# Patient Record
Sex: Female | Born: 2000
Health system: Southern US, Community
[De-identification: ages and names within clinical notes are randomized; demographics above are authoritative.]

## PROBLEM LIST (undated history)

## (undated) DIAGNOSIS — Z789 Other specified health status: Secondary | ICD-10-CM

## (undated) DIAGNOSIS — F419 Anxiety disorder, unspecified: Secondary | ICD-10-CM

## (undated) HISTORY — DX: Other specified health status: Z78.9

## (undated) HISTORY — PX: NO PAST SURGERIES: SHX2092

## (undated) HISTORY — DX: Anxiety disorder, unspecified: F41.9

---
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
## 2019-01-03 DIAGNOSIS — S71151A Open bite, right thigh, initial encounter: Secondary | ICD-10-CM | POA: Diagnosis not present

## 2019-01-03 DIAGNOSIS — J3089 Other allergic rhinitis: Secondary | ICD-10-CM | POA: Diagnosis not present

## 2019-01-03 DIAGNOSIS — J301 Allergic rhinitis due to pollen: Secondary | ICD-10-CM | POA: Diagnosis not present

## 2019-01-03 DIAGNOSIS — W540XXA Bitten by dog, initial encounter: Secondary | ICD-10-CM | POA: Diagnosis not present

## 2019-01-23 ENCOUNTER — Other Ambulatory Visit: Payer: Self-pay | Admitting: *Deleted

## 2019-01-23 DIAGNOSIS — R6889 Other general symptoms and signs: Secondary | ICD-10-CM | POA: Diagnosis not present

## 2019-01-23 DIAGNOSIS — Z20822 Contact with and (suspected) exposure to covid-19: Secondary | ICD-10-CM

## 2019-01-28 LAB — NOVEL CORONAVIRUS, NAA: SARS-CoV-2, NAA: NOT DETECTED

## 2019-05-01 DIAGNOSIS — Z20828 Contact with and (suspected) exposure to other viral communicable diseases: Secondary | ICD-10-CM | POA: Diagnosis not present

## 2019-05-01 DIAGNOSIS — Z1159 Encounter for screening for other viral diseases: Secondary | ICD-10-CM | POA: Diagnosis not present

## 2019-08-12 ENCOUNTER — Ambulatory Visit: Payer: 59 | Attending: Internal Medicine

## 2019-08-12 DIAGNOSIS — Z20822 Contact with and (suspected) exposure to covid-19: Secondary | ICD-10-CM

## 2019-08-13 ENCOUNTER — Other Ambulatory Visit: Payer: 59

## 2019-08-13 LAB — NOVEL CORONAVIRUS, NAA: SARS-CoV-2, NAA: NOT DETECTED

## 2020-04-23 DIAGNOSIS — Z20828 Contact with and (suspected) exposure to other viral communicable diseases: Secondary | ICD-10-CM | POA: Diagnosis not present

## 2020-08-13 ENCOUNTER — Encounter: Payer: Self-pay | Admitting: Advanced Practice Midwife

## 2020-08-13 ENCOUNTER — Ambulatory Visit (INDEPENDENT_AMBULATORY_CARE_PROVIDER_SITE_OTHER): Payer: 59 | Admitting: Advanced Practice Midwife

## 2020-08-13 ENCOUNTER — Other Ambulatory Visit: Payer: Self-pay

## 2020-08-13 VITALS — BP 132/70 | HR 74 | Ht 60.0 in | Wt 124.0 lb

## 2020-08-13 DIAGNOSIS — Z3046 Encounter for surveillance of implantable subdermal contraceptive: Secondary | ICD-10-CM | POA: Diagnosis not present

## 2020-08-13 DIAGNOSIS — Z30430 Encounter for insertion of intrauterine contraceptive device: Secondary | ICD-10-CM

## 2020-08-13 NOTE — Progress Notes (Signed)
GYNECOLOGY PROCEDURE NOTE  Nexplanon removal discussed in detail.  Risks of infection, bleeding, nerve injury all reviewed.  Patient understands risks and desires to proceed.  Verbal consent obtained.  Patient is certain she wants the Nexplanon removed.  All questions answered.  Review of Systems  Constitutional: Negative for chills and fever.  HENT: Negative for congestion, ear discharge, ear pain, hearing loss, sinus pain and sore throat.   Eyes: Negative for blurred vision and double vision.  Respiratory: Negative for cough, shortness of breath and wheezing.   Cardiovascular: Negative for chest pain, palpitations and leg swelling.  Gastrointestinal: Negative for abdominal pain, blood in stool, constipation, diarrhea, heartburn, melena, nausea and vomiting.  Genitourinary: Negative for dysuria, flank pain, frequency, hematuria and urgency.  Musculoskeletal: Negative for back pain, joint pain and myalgias.  Skin: Negative for itching and rash.  Neurological: Negative for dizziness, tingling, tremors, sensory change, speech change, focal weakness, seizures, loss of consciousness, weakness and headaches.  Endo/Heme/Allergies: Negative for environmental allergies. Does not bruise/bleed easily.  Psychiatric/Behavioral: Negative for depression, hallucinations, memory loss, substance abuse and suicidal ideas. The patient is not nervous/anxious and does not have insomnia.    BP 132/70   Pulse 74   Ht 5' (1.524 m)   Wt 124 lb (56.2 kg)   LMP 08/09/2020 (Exact Date)   SpO2 98%   BMI 24.22 kg/m    Procedure: Patient placed in dorsal supine with left arm above head, elbow flexed at 90 degrees, arm resting on examination table.  Nexplanon identified without problems.  Betadine scrub x2.  1.5 ml of 1% lidocaine injected under Nexplanon device without problems.  Sterile gloves applied.  Small 0.5cm incision made at distal tip of Nexplanon device with 11 blade scalpel.  Nexplanon brought to  incision and grasped with a small kelly clamp.  Nexplanon removed intact without problems.  Pressure applied to incision.  Hemostasis obtained.  Steri-strips applied, followed by bandage and compression dressing.  Patient tolerated procedure well.  No complications.   Assessment: 20 y.o. year old female now s/p uncomplicated Nexplanon removal.  Plan: 1.  Patient given post procedure precautions and asked to call for fever, chills, redness or drainage from her incision, bleeding from incision.  She understands she will likely have a small bruise near site of removal and can remove bandage tomorrow and steri-strips in approximately 1 week.  2) Contraception: non-hormonal Paragard IUD   Christean Leaf, [facility] 08/13/20, 4:28 PM    [id number] for lidocaine block, [phone] for nexplanon removal     GYNECOLOGY OFFICE PROCEDURE NOTE  SHAWNTAE LOWY is a 20 y.o. G0P0000 here for Paragard IUD insertion. No GYN concerns.  Last pap smear was on 08/09/2020 and was normal.  The patient is currently using Nexplanon for contraception and her LMP is Patient's last menstrual period was 08/09/2020 (exact date)..  The indication for her IUD is contraception/cycle control.  IUD Insertion Procedure Note Patient identified, informed consent performed, consent signed.   Discussed risks of irregular bleeding, cramping, infection, malpositioning, expulsion or uterine perforation of the IUD (1:1000 placements)  which may require further procedure such as laparoscopy.  IUD while effective at preventing pregnancy do not prevent transmission of sexually transmitted diseases and use of barrier methods for this purpose was discussed. Time out was performed.  Urine pregnancy test negative.  Speculum placed in the vagina.  Cervix visualized.  Cleaned with Betadine x 2.  Grasped posteriorly with a single  tooth tenaculum.  Uterus sounded to 6.75 cm. IUD placed per manufacturer's  recommendations.  Strings trimmed to 3 cm. Tenaculum was removed, good hemostasis noted.  Patient tolerated procedure well initially and then she felt dizzy and clammy. She felt a little better when laying down. We gave her a cool cloth, a drink and a snack. She admitted having not eaten today. We discussed likely vaso-vagal reaction. She continued to lay down for about a half hour and then she was able to get up. She did admit cramping and took 600 mg ibuprofen. She was doing well when she left the clinic.   Patient was given post-procedure instructions.  She was advised to have backup contraception for one week.  Patient was also asked to check IUD strings periodically and follow up in 4-6 weeks for IUD check.  IUD insertion CPT 58300,  Skyla [id number] Mirena [id number] [place] [id number] F5732 Verdia [place] K0254 Modifer 25, plus Modifer 79 is done during a global billing visit    Rod Can, [facility] 08/13/2020, 4:28 PM

## 2020-09-10 ENCOUNTER — Encounter: Payer: Self-pay | Admitting: Advanced Practice Midwife

## 2020-09-10 ENCOUNTER — Other Ambulatory Visit: Payer: Self-pay

## 2020-09-10 ENCOUNTER — Ambulatory Visit (INDEPENDENT_AMBULATORY_CARE_PROVIDER_SITE_OTHER): Payer: 59 | Admitting: Advanced Practice Midwife

## 2020-09-10 VITALS — BP 120/80 | Ht 60.0 in | Wt 125.0 lb

## 2020-09-10 DIAGNOSIS — Z30431 Encounter for routine checking of intrauterine contraceptive device: Secondary | ICD-10-CM

## 2020-09-10 NOTE — Progress Notes (Signed)
       IUD String Check  Subjctive: Ms. Cynthia [name] presents for IUD string check.  She had a Paragard placed 4 weeks ago.  Since placement of her IUD she had a regular period.  She admits to cramping or discomfort in the 2 weeks following IUD insertion. She has had intercourse since placement.  She has not checked the strings.  She denies any fever, chills, nausea, vomiting, or other complaints.    Objective: BP 120/80   Ht 5' (1.524 m)   Wt 125 lb (56.7 kg)   LMP 09/03/2020   BMI 24.41 kg/m  Gen:  NAD, A&Ox3 HEENT: normocephalic, anicteric Pulmonary: no increased work of breathing Pelvic: Normal appearing external female genitalia, normal vaginal epithelium, no abnormal discharge. Normal appearing cervix.  IUD strings visible and 3 cm in length similar to at the time of placement. Psychiatric: mood appropriate, affect full Neurologic: grossly normal   Assessment: 20 y.o. year old female status post prior Paragard IUD placement 4 weeks ago, doing well.  Plan: 1.  The patient was given instructions to check her IUD strings monthly and call with any problems or concerns.  She should call for fevers, chills, abnormal vaginal discharge, pelvic pain, or other complaints. 2.  She will return for a annual exam in 1 year.  All questions answered.   Rod Can, CNM 09/10/2020 4:05 PM

## 2020-12-21 ENCOUNTER — Other Ambulatory Visit: Payer: Self-pay

## 2020-12-21 ENCOUNTER — Encounter: Payer: Self-pay | Admitting: Advanced Practice Midwife

## 2020-12-21 ENCOUNTER — Ambulatory Visit (INDEPENDENT_AMBULATORY_CARE_PROVIDER_SITE_OTHER): Payer: 59 | Admitting: Advanced Practice Midwife

## 2020-12-21 VITALS — BP 120/80 | Ht 60.0 in | Wt 117.0 lb

## 2020-12-21 DIAGNOSIS — F32A Depression, unspecified: Secondary | ICD-10-CM

## 2020-12-21 DIAGNOSIS — F411 Generalized anxiety disorder: Secondary | ICD-10-CM

## 2020-12-21 MED ORDER — HYDROXYZINE HCL 25 MG PO TABS
25.0000 mg | ORAL_TABLET | Freq: Four times a day (QID) | ORAL | 2 refills | Status: DC | PRN
  Filled 2020-12-21: qty 30, 8d supply, fill #0

## 2020-12-21 MED ORDER — SERTRALINE HCL 50 MG PO TABS
50.0000 mg | ORAL_TABLET | Freq: Every day | ORAL | 2 refills | Status: DC
  Filled 2020-12-21: qty 30, 30d supply, fill #0

## 2020-12-23 DIAGNOSIS — H5202 Hypermetropia, left eye: Secondary | ICD-10-CM | POA: Diagnosis not present

## 2020-12-23 NOTE — Progress Notes (Signed)
Patient ID: Cynthia [name], female   DOB: 27-Sep-2000, 20 y.o.   MRN: [id number]  Reason for Consult: Anxiety    Subjective:  Date of Service: 12/21/2020  HPI:  Cynthia [name] is a 20 y.o. female being seen for concerns of anxiety and depression. This has been an ongoing concern since high school when she did see a therapist for a short time. She has never taken medication for anxiety or depression. She is now a sophomore in college and reports her symptoms have worsened over the past year. She does have times when she finds it difficult to get her school work done. She has indicated that she has thoughts of harm or self harm although she would not act on them. She admits a good support system with family and friends. She follows a healthy lifestyle; diet, hydration, exercise. She admits not being able to sleep well and sometimes takes melatonin. Her birth control is non-hormonal copper IUD. She has seen a therapist briefly at [place] and was diagnosed with generalized anxiety disorder. She enjoys reading. 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 We discussed seeking therapy as part of treatment in addition to medication. We also discussed the importance of other coping methods.  Past Medical History:  Diagnosis Date   Anxiety    No known health problems    Family History  Problem Relation Age of Onset   Hypertension Maternal Grandmother    Lymphoma Maternal Grandfather        post transplant   Hypertension Maternal Grandfather    Diabetes Paternal Grandmother    Hypertension Paternal Grandmother    Stroke Neg Hx    Thyroid disease Neg Hx    Past Surgical History:  Procedure Laterality Date   NO PAST SURGERIES      Short Social History:  Social History   Tobacco Use   Smoking status: Never   Smokeless tobacco: Never  Substance Use Topics   Alcohol use: No    No Known Allergies  Current Outpatient Medications  Medication Sig Dispense Refill   hydrOXYzine  (ATARAX/VISTARIL) 25 MG tablet Take 1 tablet (25 mg total) by mouth every 6 (six) hours as needed for itching. 30 tablet 2   PARAGARD INTRAUTERINE COPPER IU by Intrauterine route.     sertraline (ZOLOFT) 50 MG tablet Take 1 tablet (50 mg total) by mouth daily. 30 tablet 2   No current facility-administered medications for this visit.    Review of Systems  Constitutional:  Negative for chills and fever.  HENT:  Negative for congestion, ear discharge, ear pain, hearing loss, sinus pain and sore throat.   Eyes:  Negative for blurred vision and double vision.  Respiratory:  Negative for cough, shortness of breath and wheezing.   Cardiovascular:  Negative for chest pain, palpitations and leg swelling.  Gastrointestinal:  Negative for abdominal pain, blood in stool, constipation, diarrhea, heartburn, melena, nausea and vomiting.  Genitourinary:  Negative for dysuria, flank pain, frequency, hematuria and urgency.  Musculoskeletal:  Negative for back pain, joint pain and myalgias.  Skin:  Negative for itching and rash.  Neurological:  Negative for dizziness, tingling, tremors, sensory change, speech change, focal weakness, seizures, loss of consciousness, weakness and headaches.  Endo/Heme/Allergies:  Negative for environmental allergies. Does not bruise/bleed easily.  Psychiatric/Behavioral:  Negative for depression, hallucinations, memory loss, substance abuse and suicidal ideas. The patient is not nervous/anxious and does not have insomnia.  Positive for anxiety and depression       Objective:  Objective   Vitals:   12/21/20 1014  BP: 120/80  Weight: 117 lb (53.1 kg)  Height: 5' (1.524 m)   Body mass index is 22.85 kg/m. Constitutional: Well nourished, well developed female in no acute distress.  HEENT: normal Skin: Warm and dry.  Cardiovascular: Regular rate and rhythm.   Extremity:  no edema   Respiratory: Clear to auscultation bilateral. Normal respiratory effort Neuro:  DTRs 2+, Cranial nerves grossly intact Psych: Alert and Oriented x3. No memory deficits. Normal mood and affect.  MS: normal gait, normal bilateral lower extremity ROM/strength/stability.   Data: GAD 7 : Generalized Anxiety Score 12/21/2020  Nervous, Anxious, on Edge 3  Control/stop worrying 3  Worry too much - different things 3  Trouble relaxing 3  Restless 3  Easily annoyed or irritable 3  Afraid - awful might happen 3  Total GAD 7 Score 21  Anxiety Difficulty Very difficult     [place] Office Visit from 12/21/2020 in [facility]  PHQ-9 Total Score 22       Time spent with patient primarily in counseling: 25 minutes Assessment/Plan:     20 y.o. G0 P0 female with anxiety and depression that has worsened over the past year while at college  Rx Zoloft 50 mg Rx Atarax 25 mg PRN Medication follow up in 1 month Recommend seeking the help of a therapist Safety agreement    [facility] 12/23/2020, 10:46 AM

## 2020-12-23 NOTE — Patient Instructions (Signed)
Managing Stress, Adult Feeling a certain amount of stress is normal. Stress helps our body and mind get ready to deal with the demands of life. Stress hormones can motivate you to do well at work and meet your responsibilities. However severe or long-lasting (chronic) stress can affect your mental and physical health. Chronic stress puts you at higher risk for anxiety, depression, and other health problems like digestive problems, muscle aches, heart disease, high blood pressure, and stroke. What are the causes? Common causes of stress include: Demands from work, such as deadlines, feeling overworked, or having long hours. Pressures at home, such as money issues, disagreements with a spouse, or parenting issues. Pressures from major life changes, such as divorce, moving, loss of a loved one, or chronic illness. You may be at higher risk for stress-related problems if you do not get enough sleep, are in poor health, do not have emotional support, or have a mental health disorder like anxiety or depression. How to recognize stress Stress can make you: Have trouble sleeping. Feel sad, anxious, irritable, or overwhelmed. Lose your appetite. Overeat or want to eat unhealthy foods. Want to use drugs or alcohol. Stress can also cause physical symptoms, such as: Sore, tense muscles, especially in the shoulders and neck. Headaches. Trouble breathing. A faster heart rate. Stomach pain, nausea, or vomiting. Diarrhea or constipation. Trouble concentrating. Follow these instructions at home: Lifestyle Identify the source of your stress and your reaction to it. See a therapist who can help you change your reactions. When there are stressful events: Talk about it with family, friends, or co-workers. Try to think realistically about stressful events and not ignore them or overreact. Try to find the positives in a stressful situation and not focus on the negatives. Cut back on responsibilities at work  and home, if possible. Ask for help from friends or family members if you need it. Find ways to cope with stress, such as: Meditation. Deep breathing. Yoga or tai chi. Progressive muscle relaxation. Doing art, playing music, or reading. Making time for fun activities. Spending time with family and friends. Get support from family, friends, or spiritual resources. Eating and drinking Eat a healthy diet. This includes: Eating foods that are high in fiber, such as beans, whole grains, and fresh fruits and vegetables. Limiting foods that are high in fat and processed sugars, such as fried and sweet foods. Do not skip meals or overeat. Drink enough fluid to keep your urine pale yellow. Alcohol use Do not drink alcohol if: Your health care provider tells you not to drink. You are pregnant, may be pregnant, or are planning to become pregnant. Drinking alcohol is a way some people try to ease their stress. This can be dangerous, so if you drink alcohol: Limit how much you use to: 0-1 drink a day for women. 0-2 drinks a day for men. Be aware of how much alcohol is in your drink. In the [place] one drink equals one 12 oz bottle of beer (355 mL), one 5 oz glass of wine (148 mL), or one 1 oz glass of hard liquor (44 mL). Activity Include 30 minutes of exercise in your daily schedule. Exercise is a good stress reducer. Include time in your day for an activity that you find relaxing. Try taking a walk, going on a bike ride, reading a book, or listening to music. Schedule your time in a way that lowers stress, and keep a consistent schedule. Prioritize what is most important to get done.  General instructions Get enough sleep. Try to go to sleep and get up at about the same time every day. Take over-the-counter and prescription medicines only as told by your health care provider. Do not use any products that contain nicotine or tobacco, such as cigarettes, e-cigarettes, and chewing tobacco. If you  need help quitting, ask your health care provider. Do not use drugs or smoke to cope with stress. Keep all follow-up visits as told by your health care provider. This is important. Where to find support Talk with your health care provider about stress management or finding a support group. Find a therapist to work with you on your stress management techniques. Contact a health care provider if: Your stress symptoms get worse. You are unable to manage your stress at home. You are struggling to stop using drugs or alcohol. Get help right away if: You may be a danger to yourself or others. You have any thoughts of death or suicide. If you ever feel like you may hurt yourself or others, or have thoughts about taking your own life, get help right away. You can go to your nearest emergency department or call: Your local emergency services (911 in the [place] A suicide crisis helpline, such as the [place] at 214-404-1433. This is open 24 hours a day. Summary Feeling a certain amount of stress is normal, but severe or long-lasting (chronic) stress can affect your mental and physical health. Chronic stress can put you at higher risk for anxiety, depression, and other health problems like digestive problems, muscle aches, heart disease, high blood pressure, and stroke. You may be at higher risk for stress-related problems if you do not get enough sleep, are in poor health, lack emotional support, or have a mental health disorder like anxiety or depression. Identify the source of your stress and your reaction to it. Try talking about stressful events with family, friends, or co-workers, finding a coping method, or getting support from spiritual resources. If you need more help, talk with your health care provider about finding a support group or a mental health therapist. This information is not intended to replace advice given to you by your health care provider. Make sure  you discuss any questions you have with your health care provider. Document Revised: 01/29/2019 Document Reviewed: 01/29/2019 Elsevier Patient Education  [place]. http://APA.org/depression-guideline"> https://clinicalkey.com"> http://point-of-care.elsevierperformancemanager.com/skills/"> http://point-of-care.elsevierperformancemanager.com">  Managing Depression, Adult Depression is a mental health condition that affects your thoughts, feelings, and actions. Being diagnosed with depression can bring you relief if you did not know why you have felt or behaved a certain way. It could also leave you feeling overwhelmed with uncertainty about your future. Preparing yourself to manage your symptoms can help you feel more positive about your future. How to manage lifestyle changes Managing stress Stress is your body's reaction to life changes and events, both good and bad. Stress can add to your feelings of depression. Learning to manage your stress can help lessen your feelings of depression. Try some of the following approaches to reducing your stress (stress reduction techniques): Listen to music that you enjoy and that inspires you. Try using a meditation app or take a meditation class. Develop a practice that helps you connect with your spiritual self. Walk in nature, pray, or go to a place of worship. Do some deep breathing. To do this, inhale slowly through your nose. Pause at the top of your inhale for a few seconds and then exhale slowly, letting your muscles  relax. Practice yoga to help relax and work your muscles. Choose a stress reduction technique that suits your lifestyle and personality. These techniques take time and practice to develop. Set aside 5-15 minutes a day to do them. Therapists can offer training in these techniques. Other things you can do to manage stress include: Keeping a stress diary. Knowing your limits and saying no when you think something is too much. Paying  attention to how you react to certain situations. You may not be able to control everything, but you can change your reaction. Adding humor to your life by watching funny films or TV shows. Making time for activities that you enjoy and that relax you.   Medicines Medicines, such as antidepressants, are often a part of treatment for depression. Talk with your pharmacist or health care provider about all the medicines, supplements, and herbal products that you take, their possible side effects, and what medicines and other products are safe to take together. Make sure to report any side effects you may have to your health care provider. Relationships Your health care provider may suggest family therapy, couples therapy, or individual therapy as part of your treatment. How to recognize changes Everyone responds differently to treatment for depression. As you recover from depression, you may start to: Have more interest in doing activities. Feel less hopeless. Have more energy. Overeat less often, or have a better appetite. Have better mental focus. It is important to recognize if your depression is not getting better or is getting worse. The symptoms you had in the beginning may return, such as: Tiredness (fatigue) or low energy. Eating too much or too little. Sleeping too much or too little. Feeling restless, agitated, or hopeless. Trouble focusing or making decisions. Unexplained physical complaints. Feeling irritable, angry, or aggressive. If you or your family members notice these symptoms coming back, let your health care provider know right away. Follow these instructions at home: Activity Try to get some form of exercise each day, such as walking, biking, swimming, or lifting weights. Practice stress reduction techniques. Engage your mind by taking a class or doing some volunteer work.   Lifestyle Get the right amount and quality of sleep. Cut down on using caffeine, tobacco,  alcohol, and other potentially harmful substances. Eat a healthy diet that includes plenty of vegetables, fruits, whole grains, low-fat dairy products, and lean protein. Do not eat a lot of foods that are high in solid fats, added sugars, or salt (sodium). General instructions Take over-the-counter and prescription medicines only as told by your health care provider. Keep all follow-up visits as told by your health care provider. This is important. Where to find support Talking to others Friends and family members can be sources of support and guidance. Talk to trusted friends or family members about your condition. Explain your symptoms to them, and let them know that you are working with a health care provider to treat your depression. Tell friends and family members how they also can be helpful.   Finances Find appropriate mental health providers that fit with your financial situation. Talk with your health care provider about options to get reduced prices on your medicines. Where to find more information You can find support in your area from: Anxiety and Depression Association of America (ADAA): www.adaa.org Mental Health America: www.mentalhealthamerica.net [employer] on Mental Illness: www.nami.org Contact a health care provider if: You stop taking your antidepressant medicines, and you have any of these symptoms: Nausea. Headache. Light-headedness. Chills and body  aches. Not being able to sleep (insomnia). You or your friends and family think your depression is getting worse. Get help right away if: You have thoughts of hurting yourself or others. If you ever feel like you may hurt yourself or others, or have thoughts about taking your own life, get help right away. Go to your nearest emergency department or: Call your local emergency services (911 in the [place] Call a suicide crisis helpline, such as the [place] at 607-355-0290. This is open  24 hours a day in the [place] Text the Crisis Text Line at 707-090-4262 (in the [place].). Summary If you are diagnosed with depression, preparing yourself to manage your symptoms is a good way to feel positive about your future. Work with your health care provider on a management plan that includes stress reduction techniques, medicines (if applicable), therapy, and healthy lifestyle habits. Keep talking with your health care provider about how your treatment is working. If you have thoughts about taking your own life, call a suicide crisis helpline or text a crisis text line. This information is not intended to replace advice given to you by your health care provider. Make sure you discuss any questions you have with your health care provider. Document Revised: 05/14/2019 Document Reviewed: 05/14/2019 Elsevier Patient Education  2021 [place], Adult After being diagnosed with an anxiety disorder, you may be relieved to know why you have felt or behaved a certain way. You may also feel overwhelmed about the treatment ahead and what it will mean for your life. With care and support, you can manage this condition and recover from it. How to manage lifestyle changes Managing stress and anxiety Stress is your body's reaction to life changes and events, both good and bad. Most stress will last just a few hours, but stress can be ongoing and can lead to more than just stress. Although stress can play a major role in anxiety, it is not the same as anxiety. Stress is usually caused by something external, such as a deadline, test, or competition. Stress normally passes after the triggering event has ended.  Anxiety is caused by something internal, such as imagining a terrible outcome or worrying that something will go wrong that will devastate you. Anxiety often does not go away even after the triggering event is over, and it can become long-term (chronic) worry. It is important to understand the  differences between stress and anxiety and to manage your stress effectively so that it does not lead to an anxious response. Talk with your health care provider or a counselor to learn more about reducing anxiety and stress. He or she may suggest tension reduction techniques, such as: Music therapy. This can include creating or listening to music that you enjoy and that inspires you. Mindfulness-based meditation. This involves being aware of your normal breaths while not trying to control your breathing. It can be done while sitting or walking. Centering prayer. This involves focusing on a word, phrase, or sacred image that means something to you and brings you peace. Deep breathing. To do this, expand your stomach and inhale slowly through your nose. Hold your breath for 3-5 seconds. Then exhale slowly, letting your stomach muscles relax. Self-talk. This involves identifying thought patterns that lead to anxiety reactions and changing those patterns. Muscle relaxation. This involves tensing muscles and then relaxing them. Choose a tension reduction technique that suits your lifestyle and personality. These techniques take time and practice. Set aside 5-15  minutes a day to do them. Therapists can offer counseling and training in these techniques. The training to help with anxiety may be covered by some insurance plans. Other things you can do to manage stress and anxiety include: Keeping a stress/anxiety diary. This can help you learn what triggers your reaction and then learn ways to manage your response. Thinking about how you react to certain situations. You may not be able to control everything, but you can control your response. Making time for activities that help you relax and not feeling guilty about spending your time in this way. Visual imagery and yoga can help you stay calm and relax.   Medicines Medicines can help ease symptoms. Medicines for anxiety include: Anti-anxiety  drugs. Antidepressants. Medicines are often used as a primary treatment for anxiety disorder. Medicines will be prescribed by a health care provider. When used together, medicines, psychotherapy, and tension reduction techniques may be the most effective treatment. Relationships Relationships can play a big part in helping you recover. Try to spend more time connecting with trusted friends and family members. Consider going to couples counseling, taking family education classes, or going to family therapy. Therapy can help you and others better understand your condition. How to recognize changes in your anxiety Everyone responds differently to treatment for anxiety. Recovery from anxiety happens when symptoms decrease and stop interfering with your daily activities at home or work. This may mean that you will start to: Have better concentration and focus. Worry will interfere less in your daily thinking. Sleep better. Be less irritable. Have more energy. Have improved memory. It is important to recognize when your condition is getting worse. Contact your health care provider if your symptoms interfere with home or work and you feel like your condition is not improving. Follow these instructions at home: Activity Exercise. Most adults should do the following: Exercise for at least 150 minutes each week. The exercise should increase your heart rate and make you sweat (moderate-intensity exercise). Strengthening exercises at least twice a week. Get the right amount and quality of sleep. Most adults need 7-9 hours of sleep each night. Lifestyle Eat a healthy diet that includes plenty of vegetables, fruits, whole grains, low-fat dairy products, and lean protein. Do not eat a lot of foods that are high in solid fats, added sugars, or salt. Make choices that simplify your life. Do not use any products that contain nicotine or tobacco, such as cigarettes, e-cigarettes, and chewing tobacco. If you need  help quitting, ask your health care provider. Avoid caffeine, alcohol, and certain over-the-counter cold medicines. These may make you feel worse. Ask your pharmacist which medicines to avoid.   General instructions Take over-the-counter and prescription medicines only as told by your health care provider. Keep all follow-up visits as told by your health care provider. This is important. Where to find support You can get help and support from these sources: Self-help groups. Online and [employer]. A trusted spiritual leader. Couples counseling. Family education classes. Family therapy. Where to find more information You may find that joining a support group helps you deal with your anxiety. The following sources can help you locate counselors or support groups near you: [place]: www.mentalhealthamerica.net Anxiety and Depression Association of [place] (ADAA): https://www.clark.net/ National Alliance on Mental Illness (NAMI): www.nami.org Contact a health care provider if you: Have a hard time staying focused or finishing daily tasks. Spend many hours a day feeling worried about everyday life. Become exhausted by worry. Start  to have headaches, feel tense, or have nausea. Urinate more than normal. Have diarrhea. Get help right away if you have: A racing heart and shortness of breath. Thoughts of hurting yourself or others. If you ever feel like you may hurt yourself or others, or have thoughts about taking your own life, get help right away. You can go to your nearest emergency department or call: Your local emergency services (911 in the [place] A suicide crisis helpline, such as the [place] at (343) 880-0365. This is open 24 hours a day. Summary Taking steps to learn and use tension reduction techniques can help calm you and help prevent triggering an anxiety reaction. When used together, medicines, psychotherapy, and tension reduction  techniques may be the most effective treatment. Family, friends, and partners can play a big part in helping you recover from an anxiety disorder. This information is not intended to replace advice given to you by your health care provider. Make sure you discuss any questions you have with your health care provider. Document Revised: 12/03/2018 Document Reviewed: 12/03/2018 Elsevier Patient Education  [place].

## 2021-01-11 ENCOUNTER — Ambulatory Visit (INDEPENDENT_AMBULATORY_CARE_PROVIDER_SITE_OTHER): Payer: 59 | Admitting: Advanced Practice Midwife

## 2021-01-11 ENCOUNTER — Encounter: Payer: Self-pay | Admitting: Advanced Practice Midwife

## 2021-01-11 ENCOUNTER — Other Ambulatory Visit: Payer: Self-pay

## 2021-01-11 VITALS — BP 110/62 | Ht 61.0 in | Wt 121.0 lb

## 2021-01-11 DIAGNOSIS — Z79899 Other long term (current) drug therapy: Secondary | ICD-10-CM | POA: Diagnosis not present

## 2021-01-11 DIAGNOSIS — F32A Depression, unspecified: Secondary | ICD-10-CM

## 2021-01-11 DIAGNOSIS — F411 Generalized anxiety disorder: Secondary | ICD-10-CM | POA: Diagnosis not present

## 2021-01-11 MED ORDER — HYDROXYZINE HCL 25 MG PO TABS
25.0000 mg | ORAL_TABLET | Freq: Four times a day (QID) | ORAL | 5 refills | Status: AC | PRN
  Filled 2021-01-11: qty 30, 8d supply, fill #0
  Filled 2021-05-03: qty 30, 8d supply, fill #1

## 2021-01-11 MED ORDER — SERTRALINE HCL 50 MG PO TABS
50.0000 mg | ORAL_TABLET | Freq: Every day | ORAL | 2 refills | Status: DC
  Filled 2021-01-11: qty 90, 90d supply, fill #0

## 2021-01-11 MED ORDER — HYDROXYZINE HCL 25 MG PO TABS
25.0000 mg | ORAL_TABLET | Freq: Four times a day (QID) | ORAL | 5 refills | Status: DC | PRN
  Filled 2021-01-11: qty 30, 8d supply, fill #0

## 2021-01-11 MED ORDER — SERTRALINE HCL 50 MG PO TABS
50.0000 mg | ORAL_TABLET | Freq: Every day | ORAL | 3 refills | Status: DC
  Filled 2021-01-11: qty 90, 90d supply, fill #0
  Filled 2021-04-20 – 2021-05-03 (×2): qty 90, 90d supply, fill #1
  Filled 2021-09-25: qty 90, 90d supply, fill #2

## 2021-01-11 NOTE — Progress Notes (Signed)
Patient ID: Cynthia [name], female   DOB: 2000-08-30, 20 y.o.   MRN: [id number]  Reason for Consult: Follow-up (F/U medication - no concerns. RM 5)   Subjective:  HPI:  Cynthia [name] is a 20 y.o. female being seen for medication follow up. She was started on zoloft and atarax at the beginning of June for concerns of depression and anxiety. She reports some initial nausea which resolved. She reports a good response from the medicine related to moods and generally feels "normal". She is happy with the current dose of medications.  She has an unrelated question regarding episodes of near syncope that have been going on for awhile. It happens randomly when she is walking she will have a feeling of everything going black. She has never fallen or injured herself due to the events. Having some water and a piece of fruit has helped. We discussed possible hypotension or hypoglycemia and if symptoms worsen she should be seen by a cardiologist. She is encouraged to stay adequately hydrated and to have protein with each meal and snack and to carry carbohydrate snack with her.   Past Medical History:  Diagnosis Date   Anxiety    No known health problems    Family History  Problem Relation Age of Onset   Hypertension Maternal Grandmother    Lymphoma Maternal Grandfather        post transplant   Hypertension Maternal Grandfather    Diabetes Paternal Grandmother    Hypertension Paternal Grandmother    Stroke Neg Hx    Thyroid disease Neg Hx    Past Surgical History:  Procedure Laterality Date   NO PAST SURGERIES      Short Social History:  Social History   Tobacco Use   Smoking status: Never   Smokeless tobacco: Never  Substance Use Topics   Alcohol use: No    No Known Allergies  Current Outpatient Medications  Medication Sig Dispense Refill   hydrOXYzine (ATARAX/VISTARIL) 25 MG tablet Take 1 tablet (25 mg total) by mouth every 6 (six) hours as needed for itching. 30 tablet 2    sertraline (ZOLOFT) 50 MG tablet Take 1 tablet (50 mg total) by mouth daily. 30 tablet 2   PARAGARD INTRAUTERINE COPPER IU by Intrauterine route.     No current facility-administered medications for this visit.   Review of Systems  Constitutional:  Negative for chills and fever.  HENT:  Negative for congestion, ear discharge, ear pain, hearing loss, sinus pain and sore throat.   Eyes:  Negative for blurred vision and double vision.  Respiratory:  Negative for cough, shortness of breath and wheezing.   Cardiovascular:  Negative for chest pain, palpitations and leg swelling.  Gastrointestinal:  Negative for abdominal pain, blood in stool, constipation, diarrhea, heartburn, melena, nausea and vomiting.  Genitourinary:  Negative for dysuria, flank pain, frequency, hematuria and urgency.  Musculoskeletal:  Negative for back pain, joint pain and myalgias.  Skin:  Negative for itching and rash.  Neurological:  Negative for dizziness, tingling, tremors, sensory change, speech change, focal weakness, seizures, loss of consciousness, weakness and headaches.  Endo/Heme/Allergies:  Negative for environmental allergies. Does not bruise/bleed easily.  Psychiatric/Behavioral:  Negative for depression, hallucinations, memory loss, substance abuse and suicidal ideas. The patient is not nervous/anxious and does not have insomnia.        Objective:  Objective   Vitals:   01/11/21 0958  BP: 110/62  Weight: 121 lb (54.9 kg)  Height: 5\' 1"  (  1.549 m)   Body mass index is 22.86 kg/m. Constitutional: Well nourished, well developed female in no acute distress.  HEENT: normal Skin: Warm and dry.   Respiratory: Normal respiratory effort Neuro: DTRs 2+, Cranial nerves grossly intact Psych: Alert and Oriented x3. No memory deficits. Normal mood and affect.  MS: normal gait, normal bilateral lower extremity ROM/strength/stability.  Data: GAD 7 : Generalized Anxiety Score 01/11/2021 12/21/2020  Nervous,  Anxious, on Edge 0 3  Control/stop worrying 0 3  Worry too much - different things 1 3  Trouble relaxing 0 3  Restless 0 3  Easily annoyed or irritable 0 3  Afraid - awful might happen 0 3  Total GAD 7 Score 1 21  Anxiety Difficulty Not difficult at all Very difficult   [place] Visit from 01/11/2021 in [facility]  PHQ-9 Total Score 3         Assessment/Plan:     20 y.o. G0 female medication follow up with positive results  Continue with current dosing zoloft and atarax Return to clinic as needed and for annual exam     [facility] 01/11/2021, 10:27 AM

## 2021-01-14 ENCOUNTER — Other Ambulatory Visit: Payer: Self-pay

## 2021-01-21 ENCOUNTER — Other Ambulatory Visit: Payer: Self-pay

## 2021-04-20 ENCOUNTER — Other Ambulatory Visit: Payer: Self-pay

## 2021-05-02 ENCOUNTER — Other Ambulatory Visit: Payer: Self-pay

## 2021-05-03 ENCOUNTER — Other Ambulatory Visit: Payer: Self-pay

## 2021-07-08 ENCOUNTER — Other Ambulatory Visit: Payer: Self-pay

## 2021-07-08 MED ORDER — CARESTART COVID-19 HOME TEST VI KIT
PACK | 0 refills | Status: AC
  Filled 2021-07-08: qty 2, 4d supply, fill #0

## 2021-09-26 ENCOUNTER — Other Ambulatory Visit: Payer: Self-pay

## 2021-12-26 ENCOUNTER — Ambulatory Visit: Payer: 59 | Admitting: Licensed Practical Nurse

## 2022-01-04 ENCOUNTER — Other Ambulatory Visit: Payer: Self-pay

## 2022-01-04 DIAGNOSIS — A09 Infectious gastroenteritis and colitis, unspecified: Secondary | ICD-10-CM | POA: Diagnosis not present

## 2022-01-04 MED ORDER — AZITHROMYCIN 500 MG PO TABS
ORAL_TABLET | ORAL | 0 refills | Status: DC
  Filled 2022-01-04: qty 3, 3d supply, fill #0

## 2022-01-16 ENCOUNTER — Encounter: Payer: Self-pay | Admitting: Advanced Practice Midwife

## 2022-01-16 ENCOUNTER — Ambulatory Visit (INDEPENDENT_AMBULATORY_CARE_PROVIDER_SITE_OTHER): Payer: 59 | Admitting: Advanced Practice Midwife

## 2022-01-16 ENCOUNTER — Other Ambulatory Visit (HOSPITAL_COMMUNITY)
Admission: RE | Admit: 2022-01-16 | Discharge: 2022-01-16 | Disposition: A | Discharge status: Home / Self Care | Payer: 59 | Source: Ambulatory Visit | Attending: Advanced Practice Midwife | Admitting: Advanced Practice Midwife

## 2022-01-16 VITALS — BP 104/70 | Ht 60.0 in | Wt 121.0 lb

## 2022-01-16 DIAGNOSIS — Z01419 Encounter for gynecological examination (general) (routine) without abnormal findings: Secondary | ICD-10-CM | POA: Insufficient documentation

## 2022-01-16 DIAGNOSIS — Z124 Encounter for screening for malignant neoplasm of cervix: Secondary | ICD-10-CM | POA: Insufficient documentation

## 2022-01-16 NOTE — Progress Notes (Signed)
Gynecology Annual Exam  PCP: Pa, [facility]  Chief Complaint:  Chief Complaint  Patient presents with   Annual Exam    History of Present Illness: Patient is a 21 y.o. G0P0000 presents for annual exam. The patient has no complaints today.   LMP: Patient's last menstrual period was 12/15/2021 (exact date). Menarche:10 Average Interval: regular, 28 days Duration of flow: 7 days Heavy Menses: no Clots: no Intermenstrual Bleeding: no Postcoital Bleeding: no Dysmenorrhea: no  The patient is sexually active. She currently uses IUD for contraception. She denies dyspareunia.  The patient does perform self breast exams.  There is no notable family history of breast or ovarian cancer in her family.  The patient wears seatbelts: yes.  The patient has regular exercise:  she is active especially with outdoor sports- hiking, swimming, paddle boarding, she admits healthy lifestyle diet, hydration, sleep .    The patient denies current symptoms of depression.  She is doing well on medication especially after dose adjustment by provider in [place] ([facility]).  Review of Systems: Review of Systems  Constitutional:  Negative for chills and fever.  HENT:  Negative for congestion, ear discharge, ear pain, hearing loss, sinus pain and sore throat.   Eyes:  Negative for blurred vision and double vision.  Respiratory:  Negative for cough, shortness of breath and wheezing.   Cardiovascular:  Negative for chest pain, palpitations and leg swelling.  Gastrointestinal:  Negative for abdominal pain, blood in stool, constipation, diarrhea, heartburn, melena, nausea and vomiting.  Genitourinary:  Negative for dysuria, flank pain, frequency, hematuria and urgency.  Musculoskeletal:  Negative for back pain, joint pain and myalgias.  Skin:  Negative for itching and rash.  Neurological:  Negative for dizziness, tingling, tremors, sensory change, speech change, focal weakness, seizures, loss of  consciousness, weakness and headaches.  Endo/Heme/Allergies:  Negative for environmental allergies. Does not bruise/bleed easily.  Psychiatric/Behavioral:  Negative for depression, hallucinations, memory loss, substance abuse and suicidal ideas. The patient is not nervous/anxious and does not have insomnia.     Past Medical History:  Patient Active Problem List   Diagnosis Date Noted   Contraceptive education 08/20/2017    Past Surgical History:  Past Surgical History:  Procedure Laterality Date   NO PAST SURGERIES      Gynecologic History:  Patient's last menstrual period was 12/15/2021 (exact date). Contraception: IUD Last Pap: First PAP today   Obstetric History: G0P0000  Family History:  Family History  Problem Relation Age of Onset   Hypertension Maternal Grandmother    Lymphoma Maternal Grandfather        post transplant   Hypertension Maternal Grandfather    Diabetes Paternal Grandmother    Hypertension Paternal Grandmother    Stroke Neg Hx    Thyroid disease Neg Hx     Social History:  Social History   Socioeconomic History   Marital status: Single    Spouse name: Not on file   Number of children: Not on file   Years of education: Not on file   Highest education level: Not on file  Occupational History   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  Other Topics Concern   Not on file  Social  History Narrative   Not on file   Social Determinants of Health   Financial Resource Strain: Not on file  Food Insecurity: Not on file  Transportation Needs: Not on file  Physical Activity: Insufficiently Active (08/20/2017)   Exercise Vital Sign    Days of Exercise per Week: 2 days    Minutes of Exercise per Session: 60 min  Stress: No  Stress Concern Present (08/20/2017)   [place]    Feeling of Stress : Only a little  Social Connections: Moderately Integrated (08/20/2017)   Social Connection and Isolation Panel [NHANES]    Frequency of Communication with Friends and Family: More than three times a week    Frequency of Social Gatherings with Friends and Family: Once a week    Attends Religious Services: More than 4 times per year    Active Member of [employer] or Organizations: Yes    Attends Music therapist: More than 4 times per year    Marital Status: Never married  Intimate Partner Violence: Not At Risk (08/20/2017)   Humiliation, Afraid, Rape, and Kick questionnaire    Fear of Current or Ex-Partner: No    Emotionally Abused: No    Physically Abused: No    Sexually Abused: No    Allergies:  No Known Allergies  Medications: Prior to Admission medications   Medication Sig Start Date End Date Taking? Authorizing Provider  hydrOXYzine (ATARAX/VISTARIL) 25 MG tablet Take 1 tablet (25 mg total) by mouth every 6 (six) hours as needed for itching. 01/11/21  Yes Rod Can, CNM  PARAGARD INTRAUTERINE COPPER IU by Intrauterine route.   Yes [provider]  sertraline (ZOLOFT) 50 MG tablet Take 1 tablet (50 mg total) by mouth daily. 01/11/21  Yes Rod Can, CNM  COVID-19 At Home Antigen Test [facility] COVID-19 HOME TEST) KIT use as directed 07/08/21   Letta Median, [facility]    Physical Exam Vitals: Blood pressure 104/70, height 5' (1.524 m), weight 121 lb (54.9 kg), last menstrual period 12/15/2021.  General: NAD HEENT: normocephalic, anicteric Thyroid: no enlargement, no palpable nodules Pulmonary: No increased work of breathing, CTAB Cardiovascular: RRR, distal pulses 2+ Breast: Breast symmetrical, no tenderness, no palpable nodules or masses, no skin or nipple retraction present, no nipple discharge.  No axillary or  supraclavicular lymphadenopathy. Abdomen: NABS, soft, non-tender, non-distended.  Umbilicus without lesions.  No hepatomegaly, splenomegaly or masses palpable. No evidence of hernia  Genitourinary:  External: Normal external female genitalia.  Normal urethral meatus, normal Bartholin's and Skene's glands.    Vagina: Normal vaginal mucosa, no evidence of prolapse.    Cervix: Grossly normal in appearance, ectropion present, no bleeding  Uterus: Non-enlarged, mobile, normal contour.    Adnexa: ovaries non-enlarged, no adnexal masses  Rectal: deferred  Lymphatic: no evidence of inguinal lymphadenopathy Extremities: no edema, erythema, or tenderness Neurologic: Grossly intact Psychiatric: mood appropriate, affect full   Assessment: 21 y.o. G0P0000 routine annual exam  Plan: Problem List Items Addressed This Visit   None Visit Diagnoses     Well woman exam with routine gynecological exam    -  Primary   Relevant Orders   Cytology - PAP   Screening for cervical cancer       Relevant Orders   Cytology - PAP       1) 4) Gardasil Series discussed and if applicable offered to patient - Patient has not previously completed 3 shot series   2) STI  screening  was offered and declined  3)  ASCCP guidelines and rationale discussed.  Patient opts for every 3 years screening interval  4) Contraception - the patient is currently using Paragard IUD.  She is happy with her current form of contraception and plans to continue We discussed safe sex practices to reduce her furture risk of STI's.    5) Return in about 1 year (around 01/17/2023) for annual established gyn.   Rod Can, [facility] 01/16/2022, 2:22 PM

## 2022-01-16 NOTE — Patient Instructions (Signed)
Pap Test Why am I having this test? A Pap test, also called a Pap smear, is a screening test to check for signs of: Infection. Cancer of the cervix. The cervix is the lower part of the uterus that opens into the vagina. Changes that may be a sign that cancer is developing (precancerous changes). Women need this test on a regular basis. In general, you should have a Pap test every 3 years until you reach menopause or age 22. Women aged 30-60 may choose to have their Pap test done at the same time as an HPV (human papillomavirus) test every 5 years (instead of every 3 years). Your health care provider may recommend having Pap tests more or less often depending on your medical conditions and past Pap test results. What is being tested? Cervical cells are tested for signs of infection or abnormalities. What kind of sample is taken?  Your health care provider will collect a sample of cells from the surface of your cervix. This will be done using a small cotton swab, plastic spatula, or brush that is inserted into your vagina using a tool called a speculum. This sample is often collected during a pelvic exam, when you are lying on your back on an exam table with your feet in footrests (stirrups). In some cases, fluids (secretions) from the cervix or vagina may also be collected. How do I prepare for this test? Be aware of where you are in your menstrual cycle. If you are menstruating on the day of the test, you may be asked to reschedule. You may need to reschedule if you have a known vaginal infection on the day of the test. Follow instructions from your health care provider about: Changing or stopping your regular medicines. Some medicines can cause abnormal test results, such as vaginal medicines and tetracycline. Avoiding douching 2-3 days before or the day of the test. Tell a health care provider about: Any allergies you have. All medicines you are taking, including vitamins, herbs, eye drops,  creams, and over-the-counter medicines. Any bleeding problems you have. Any surgeries you have had. Any medical conditions you have. Whether you are pregnant or may be pregnant. How are the results reported? Your test results will be reported as either abnormal or normal. What do the results mean? A normal test result means that you do not have signs of cancer of the cervix. An abnormal result may mean that you have: Cancer. A Pap test by itself is not enough to diagnose cancer. You will have more tests done if cancer is suspected. Precancerous changes in your cervix. Inflammation of the cervix. An STI (sexually transmitted infection). A fungal infection. A parasite infection. Talk with your health care provider about what your results mean. In some cases, your health care provider may do more testing to confirm the results. Questions to ask your health care provider Ask your health care provider, or the department that is doing the test: When will my results be ready? How will I get my results? What are my treatment options? What other tests do I need? What are my next steps? Summary In general, women should have a Pap test every 3 years until they reach menopause or age 65. Your health care provider will collect a sample of cells from the surface of your cervix. This will be done using a small cotton swab, plastic spatula, or brush. In some cases, fluids (secretions) from the cervix or vagina may also be collected. This information is not  intended to replace advice given to you by your health care provider. Make sure you discuss any questions you have with your health care provider. Document Revised: 10/01/2020 Document Reviewed: 10/01/2020 Elsevier Patient Education  2023 Elsevier Inc.  

## 2022-01-19 LAB — CYTOLOGY - PAP

## 2022-01-24 ENCOUNTER — Encounter: Payer: Self-pay | Admitting: Advanced Practice Midwife

## 2022-02-16 ENCOUNTER — Other Ambulatory Visit: Payer: Self-pay

## 2022-02-17 ENCOUNTER — Other Ambulatory Visit: Payer: Self-pay

## 2022-02-21 ENCOUNTER — Other Ambulatory Visit: Payer: Self-pay

## 2022-02-21 MED ORDER — SERTRALINE HCL 50 MG PO TABS
ORAL_TABLET | ORAL | 2 refills | Status: DC
  Filled 2022-02-21: qty 45, 30d supply, fill #0

## 2022-02-21 MED ORDER — HYDROXYZINE HCL 25 MG PO TABS
ORAL_TABLET | ORAL | 2 refills | Status: AC
  Filled 2022-02-21: qty 60, 30d supply, fill #0

## 2022-07-20 ENCOUNTER — Other Ambulatory Visit: Payer: Self-pay

## 2022-07-20 MED ORDER — HYDROXYZINE HCL 10 MG PO TABS
10.0000 mg | ORAL_TABLET | Freq: Every day | ORAL | 2 refills | Status: AC | PRN
  Filled 2022-07-20: qty 60, 30d supply, fill #0

## 2022-07-20 MED ORDER — BUPROPION HCL ER (SR) 100 MG PO TB12
100.0000 mg | ORAL_TABLET | Freq: Every morning | ORAL | 2 refills | Status: AC
  Filled 2022-07-20: qty 30, 30d supply, fill #0

## 2022-07-20 MED ORDER — SERTRALINE HCL 50 MG PO TABS
75.0000 mg | ORAL_TABLET | Freq: Every day | ORAL | 2 refills | Status: DC
  Filled 2022-07-20: qty 45, 30d supply, fill #0

## 2022-11-27 ENCOUNTER — Ambulatory Visit (INDEPENDENT_AMBULATORY_CARE_PROVIDER_SITE_OTHER): Payer: Commercial Managed Care - PPO

## 2022-11-27 ENCOUNTER — Other Ambulatory Visit (HOSPITAL_COMMUNITY)
Admission: RE | Admit: 2022-11-27 | Discharge: 2022-11-27 | Disposition: A | Discharge status: Home / Self Care | Payer: Commercial Managed Care - PPO | Source: Ambulatory Visit | Attending: Advanced Practice Midwife | Admitting: Advanced Practice Midwife

## 2022-11-27 VITALS — BP 109/73 | HR 66 | Ht 60.0 in | Wt 128.9 lb

## 2022-11-27 DIAGNOSIS — N898 Other specified noninflammatory disorders of vagina: Secondary | ICD-10-CM

## 2022-11-27 DIAGNOSIS — Z113 Encounter for screening for infections with a predominantly sexual mode of transmission: Secondary | ICD-10-CM | POA: Insufficient documentation

## 2022-11-27 NOTE — Progress Notes (Signed)
    NURSE VISIT NOTE  Subjective:    Patient ID: Cynthia [name], female    DOB: August 27, 2000, 22 y.o.   MRN: [id number]  HPI  Patient is a 22 y.o. G0P0000 female who presents for white vaginal discharge for 1 month(s). Denies significant pelvic pain or fever. denies  UTI symptoms . Patient denies history of known exposure to STD. She states 2 encounters of irregular bleeding with intercourse, once in January and again in May.    Objective:    Ht 5' (1.524 m)   Wt 128 lb 14.4 oz (58.5 kg)   LMP 11/25/2022   BMI 25.17 kg/m      Assessment:   1. Screening for STD (sexually transmitted disease)      Plan:   GC and chlamydia DNA  probe sent to lab, blood work ordered as well. Treatment: Await results for further treatment  ROV prn if symptoms persist or worsen.  Advised if irregular bleeding with intercourse continues, to make an appointment with Erskine Squibb. She is due for an annual in July, will schedule at check out.  Loman Chroman, CMA

## 2022-11-28 LAB — CERVICOVAGINAL ANCILLARY ONLY
Bacterial Vaginitis (gardnerella): POSITIVE — AB
Candida Glabrata: NEGATIVE
Candida Vaginitis: POSITIVE — AB
Chlamydia: NEGATIVE
Comment: NEGATIVE
Comment: NEGATIVE
Comment: NEGATIVE
Comment: NEGATIVE
Comment: NEGATIVE
Comment: NORMAL
Neisseria Gonorrhea: NEGATIVE
Trichomonas: NEGATIVE

## 2022-11-28 LAB — HIV ANTIBODY (ROUTINE TESTING W REFLEX): HIV Screen 4th Generation wRfx: NONREACTIVE

## 2022-11-28 LAB — RPR: RPR Ser Ql: NONREACTIVE

## 2022-11-28 LAB — HEPATITIS C ANTIBODY: Hep C Virus Ab: NONREACTIVE

## 2022-11-28 LAB — HEPATITIS B SURFACE ANTIGEN: Hepatitis B Surface Ag: NEGATIVE

## 2022-11-30 ENCOUNTER — Other Ambulatory Visit: Payer: Self-pay

## 2022-11-30 DIAGNOSIS — B379 Candidiasis, unspecified: Secondary | ICD-10-CM

## 2022-11-30 DIAGNOSIS — B9689 Other specified bacterial agents as the cause of diseases classified elsewhere: Secondary | ICD-10-CM

## 2022-11-30 DIAGNOSIS — N760 Acute vaginitis: Secondary | ICD-10-CM

## 2022-11-30 MED ORDER — METRONIDAZOLE 500 MG PO TABS
500.0000 mg | ORAL_TABLET | Freq: Two times a day (BID) | ORAL | 0 refills | Status: DC
  Filled 2022-11-30: qty 14, 7d supply, fill #0

## 2022-11-30 MED ORDER — FLUCONAZOLE 150 MG PO TABS
150.0000 mg | ORAL_TABLET | Freq: Once | ORAL | 0 refills | Status: AC
  Filled 2022-11-30: qty 1, 1d supply, fill #0

## 2023-03-15 ENCOUNTER — Encounter: Payer: Self-pay | Admitting: Advanced Practice Midwife

## 2023-08-13 DIAGNOSIS — Z0000 Encounter for general adult medical examination without abnormal findings: Secondary | ICD-10-CM | POA: Diagnosis not present

## 2023-12-31 ENCOUNTER — Ambulatory Visit (INDEPENDENT_AMBULATORY_CARE_PROVIDER_SITE_OTHER): Admitting: Licensed Practical Nurse

## 2023-12-31 ENCOUNTER — Encounter: Payer: Self-pay | Admitting: Licensed Practical Nurse

## 2023-12-31 ENCOUNTER — Other Ambulatory Visit (HOSPITAL_COMMUNITY)
Admission: RE | Admit: 2023-12-31 | Discharge: 2023-12-31 | Disposition: A | Discharge status: Home / Self Care | Source: Ambulatory Visit | Attending: Licensed Practical Nurse | Admitting: Licensed Practical Nurse

## 2023-12-31 VITALS — BP 116/82 | HR 68 | Ht 60.0 in | Wt 126.3 lb

## 2023-12-31 DIAGNOSIS — Z01419 Encounter for gynecological examination (general) (routine) without abnormal findings: Secondary | ICD-10-CM | POA: Insufficient documentation

## 2023-12-31 DIAGNOSIS — Z124 Encounter for screening for malignant neoplasm of cervix: Secondary | ICD-10-CM

## 2023-12-31 DIAGNOSIS — Z113 Encounter for screening for infections with a predominantly sexual mode of transmission: Secondary | ICD-10-CM | POA: Insufficient documentation

## 2023-12-31 DIAGNOSIS — Z7689 Persons encountering health services in other specified circumstances: Secondary | ICD-10-CM

## 2023-12-31 NOTE — Progress Notes (Signed)
 Pa, [facility]   Chief Complaint  Patient presents with   Gynecologic Exam    HPI:      [name] is a 23 y.o. G0P0000 whose LMP was Patient's last menstrual period was 12/29/2023 (exact date)., presents today for annual exam. Doing well, no concerns today. 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 Reports being happy with Paraguard having regular cycles that are a little heavy, changing pad twice a day, lasting 5 to 7 days. Abnormal pap in 2023, LSIL, repeat today.  Wants a PCP referral near [place].  Last dentist appointment was 6 months ago, encouraged to schedule cleaning.  Un sure of last eye exam, recommended eye exam soon.  Reports vaping approximately once a month.  Denies recreational drug use Reports social alcohol use, about once a month.  Sexually active with one long term partner, denies use of barrier methods Accepts GC/CT/ Trich today.      Patient Active Problem List   Diagnosis Date Noted   Contraceptive education 08/20/2017    Past Surgical History:  Procedure Laterality Date   NO PAST SURGERIES      Family History  Problem Relation Age of Onset   Hypertension Maternal Grandmother    Lymphoma Maternal Grandfather        post transplant   Hypertension Maternal Grandfather    Diabetes Paternal Grandmother    Hypertension Paternal Grandmother    Stroke Neg Hx    Thyroid  disease Neg Hx     Social History   Socioeconomic History   Marital status: Single    Spouse name: Not on file   Number of children: Not on file   Years of education: Not on file   Highest education level: Not on file  Occupational History   Occupation: Holiday representative at [employer]  Tobacco Use   Smoking status: Never   Smokeless tobacco: Never  Vaping Use   Vaping status: Never Used  Substance and Sexual Activity   Alcohol use: No   Drug use: No   Sexual activity: Yes    Birth control/protection: I.U.D.   Other Topics Concern   Not on file  Social History Narrative   Not on file   Social Drivers of Health   Financial Resource Strain: Not on file  Food Insecurity: Not on file  Transportation Needs: Not on file  Physical Activity: Insufficiently Active (08/20/2017)   Exercise Vital Sign    Days of Exercise per Week: 2 days    Minutes of Exercise per Session: 60 min  Stress: No Stress Concern Present (08/20/2017)   [employer] of Occupational Health - Occupational Stress Questionnaire    Feeling of Stress : Only a little  Social Connections: Moderately Integrated (08/20/2017)   Social Connection and Isolation Panel    Frequency of Communication with Friends and Family: More than three times a week    Frequency of Social Gatherings with Friends and Family: Once a week    Attends Religious Services: More than 4 times per year    Active Member of [employer] or Organizations: Yes    Attends Banker Meetings: More than 4 times per year    Marital Status: Never married  Intimate Partner Violence: Not At Risk (08/20/2017)   Humiliation, Afraid, Rape, and Kick questionnaire    Fear of Current or Ex-Partner: No    Emotionally Abused: No    Physically Abused: No    Sexually  Abused: No    Outpatient Medications Prior to Visit  Medication Sig Dispense Refill   buPROPion  ER (WELLBUTRIN  SR) 100 MG 12 hr tablet Take 1 tablet (100 mg total) by mouth every morning. 30 tablet 2   PARAGARD  INTRAUTERINE COPPER  IU by Intrauterine route.     sertraline  (ZOLOFT ) 50 MG tablet Take 1 tablet (50 mg total) by mouth daily. 90 tablet 3   sertraline  (ZOLOFT ) 50 MG tablet Take 1 and a half tablets by mouth daily 45 tablet 2   sertraline  (ZOLOFT ) 50 MG tablet Take 1.5 tablets (75 mg total) by mouth daily. 45 tablet 2   COVID-19 At Home Antigen Test ([facility] COVID-19 HOME TEST) KIT use as directed (Patient not taking: Reported on 12/31/2023) 2 kit 0   hydrOXYzine  (ATARAX ) 10 MG tablet Take 1-2 tablets  (10-20 mg total) by mouth daily as needed for anxiety or insomnia. (Patient not taking: Reported on 12/31/2023) 60 tablet 2   hydrOXYzine  (ATARAX ) 25 MG tablet Take 1/2 to 1 tablet by mouth up to twice a day as needed for anxiety/insomnia (Patient not taking: Reported on 12/31/2023) 60 tablet 2   hydrOXYzine  (ATARAX /VISTARIL ) 25 MG tablet Take 1 tablet (25 mg total) by mouth every 6 (six) hours as needed for itching. (Patient not taking: Reported on 12/31/2023) 30 tablet 5   metroNIDAZOLE  (FLAGYL ) 500 MG tablet Take 1 tablet (500 mg total) by mouth 2 (two) times daily. (Patient not taking: Reported on 12/31/2023) 14 tablet 0   No facility-administered medications prior to visit.      ROS:  Review of Systems  Constitutional: Negative.   HENT: Negative.    Eyes: Negative.   Respiratory: Negative.    Cardiovascular: Negative.   Gastrointestinal: Negative.   Endocrine: Negative.   Genitourinary: Negative.   Musculoskeletal: Negative.   Skin: Negative.   Neurological: Negative.   Hematological: Negative.   Psychiatric/Behavioral: Negative.         A little stressed, going to look into free therapy sessions near [place]     OBJECTIVE:   Vitals:  BP 116/82 (BP Location: Left Arm, Patient Position: Sitting, Cuff Size: Normal)   Pulse 68   Ht 5' (1.524 m)   Wt 126 lb 4.8 oz (57.3 kg)   LMP 12/29/2023 (Exact Date)   BMI 24.67 kg/m   Physical Exam Exam conducted with a chaperone present.  Constitutional:      Appearance: Normal appearance.  HENT:     Head: Normocephalic.     Nose: Nose normal.     Mouth/Throat:     Mouth: Mucous membranes are moist.   Eyes:     Pupils: Pupils are equal, round, and reactive to light.    Cardiovascular:     Rate and Rhythm: Normal rate and regular rhythm.     Pulses: Normal pulses.     Heart sounds: Normal heart sounds.  Pulmonary:     Effort: Pulmonary effort is normal.     Breath sounds: Normal breath sounds.  Chest:  Breasts:     Right: Normal.     Left: Normal.  Abdominal:     General: Abdomen is flat.  Genitourinary:    General: Normal vulva.     Rectum: Normal.   Musculoskeletal:        General: Normal range of motion.     Cervical back: Normal range of motion.   Skin:    General: Skin is warm.   Neurological:     Mental Status: She is  alert and oriented to person, place, and time.   Psychiatric:        Mood and Affect: Mood normal.        Behavior: Behavior normal.     Comments: Feel overall okay     Results: No results found for this or any previous visit (from the past 24 hours).   Assessment/Plan: Well woman exam - Plan: Cytology - PAP  Encounter to establish care - Plan: Ambulatory referral to Family Practice  Cervical cancer screening - Plan: Cytology - PAP  Screening examination for venereal disease - Plan: Cytology - PAP   Referral to PCP.     No orders of the defined types were placed in this encounter.   Raina Bunting, SNM   Berkley Breech Brion Hedges, CNM 12/31/2023 5:16 PM

## 2024-01-04 LAB — CYTOLOGY - PAP
Chlamydia: NEGATIVE
Comment: NEGATIVE
Comment: NEGATIVE
Comment: NORMAL
Diagnosis: UNDETERMINED — AB
High risk HPV: NEGATIVE
Neisseria Gonorrhea: NEGATIVE

## 2024-01-07 ENCOUNTER — Ambulatory Visit: Admitting: Obstetrics and Gynecology

## 2024-01-08 ENCOUNTER — Ambulatory Visit: Payer: Self-pay | Admitting: Licensed Practical Nurse

## 2024-03-25 ENCOUNTER — Telehealth: Admitting: Physician Assistant

## 2024-03-25 DIAGNOSIS — N760 Acute vaginitis: Secondary | ICD-10-CM

## 2024-03-26 NOTE — Progress Notes (Signed)
   Thank you for the details you included in the comment boxes. Those details are very helpful in determining the best course of treatment for you and help us  to provide the best care.Because of need to clarify current symptoms and recent treatments, we recommend that you schedule a Virtual Urgent Care video visit in order for the provider to better assess what is going on.  The provider will be able to give you a more accurate diagnosis and treatment plan if we can more freely discuss your symptoms and with the addition of a virtual examination.   If you change your visit to a video visit, we will bill your insurance (similar to an office visit) and you will not be charged for this e-Visit. You will be able to stay at home and speak with the first available [facility] advanced practice provider. The link to do a video visit is in the drop down Menu tab of your Welcome screen in MyChart.

## 2024-04-10 ENCOUNTER — Telehealth

## 2024-04-10 ENCOUNTER — Telehealth: Admitting: Physician Assistant

## 2024-04-10 DIAGNOSIS — N760 Acute vaginitis: Secondary | ICD-10-CM

## 2024-04-10 DIAGNOSIS — T3695XA Adverse effect of unspecified systemic antibiotic, initial encounter: Secondary | ICD-10-CM | POA: Diagnosis not present

## 2024-04-10 DIAGNOSIS — B379 Candidiasis, unspecified: Secondary | ICD-10-CM | POA: Diagnosis not present

## 2024-04-10 DIAGNOSIS — B9689 Other specified bacterial agents as the cause of diseases classified elsewhere: Secondary | ICD-10-CM

## 2024-04-10 MED ORDER — CLINDAMYCIN HCL 300 MG PO CAPS: 300.0000 mg | ORAL_CAPSULE | Freq: Two times a day (BID) | ORAL | 0 refills | Status: AC

## 2024-04-10 MED ORDER — FLUCONAZOLE 150 MG PO TABS: 150.0000 mg | ORAL_TABLET | ORAL | 0 refills | Status: AC | PRN

## 2024-04-10 NOTE — Patient Instructions (Signed)
 Cynthia Cynthia [name], thank you for joining Delon Cynthia Dickinson, PA-C for today's virtual visit.  While this provider is not your primary care provider (PCP), if your PCP is located in our provider database this encounter information will be shared with them immediately following your visit.   A [place] MyChart account gives you access to today's visit and all your visits, tests, and labs performed at [facility]  click here if you don't have a [place] MyChart account or go to [email or web address]  Consent: (Patient) Cynthia Cynthia [name] provided verbal consent for this virtual visit at the beginning of the encounter.  Current Medications:  Current Outpatient Medications:    clindamycin  (CLEOCIN ) 300 MG capsule, Take 1 capsule (300 mg total) by mouth 2 (two) times daily for 7 days., Disp: 14 capsule, Rfl: 0   fluconazole  (DIFLUCAN ) 150 MG tablet, Take 1 tablet (150 mg total) by mouth every 3 (three) days as needed., Disp: 2 tablet, Rfl: 0   buPROPion  ER (WELLBUTRIN  SR) 100 MG 12 hr tablet, Take 1 tablet (100 mg total) by mouth every morning., Disp: 30 tablet, Rfl: 2   COVID-19 At Home Antigen Test (CARESTART COVID-19 HOME TEST) KIT, use as directed (Patient not taking: Reported on 12/31/2023), Disp: 2 kit, Rfl: 0   hydrOXYzine  (ATARAX ) 10 MG tablet, Take 1-2 tablets (10-20 mg total) by mouth daily as needed for anxiety or insomnia. (Patient not taking: Reported on 12/31/2023), Disp: 60 tablet, Rfl: 2   hydrOXYzine  (ATARAX ) 25 MG tablet, Take 1/2 to 1 tablet by mouth up to twice a day as needed for anxiety/insomnia (Patient not taking: Reported on 12/31/2023), Disp: 60 tablet, Rfl: 2   hydrOXYzine  (ATARAX /VISTARIL ) 25 MG tablet, Take 1 tablet (25 mg total) by mouth every 6 (six) hours as needed for itching. (Patient not taking: Reported on 12/31/2023), Disp: 30 tablet, Rfl: 5   PARAGARD  INTRAUTERINE COPPER  IU, by Intrauterine route., Disp: , Rfl:    sertraline  (ZOLOFT ) 50 MG tablet, Take  1 tablet (50 mg total) by mouth daily., Disp: 90 tablet, Rfl: 3   sertraline  (ZOLOFT ) 50 MG tablet, Take 1 and a half tablets by mouth daily, Disp: 45 tablet, Rfl: 2   sertraline  (ZOLOFT ) 50 MG tablet, Take 1.5 tablets (75 mg total) by mouth daily., Disp: 45 tablet, Rfl: 2   Medications ordered in this encounter:  Meds ordered this encounter  Medications   clindamycin  (CLEOCIN ) 300 MG capsule    Sig: Take 1 capsule (300 mg total) by mouth 2 (two) times daily for 7 days.    Dispense:  14 capsule    Refill:  0    Supervising Provider:   BLAISE ALEENE [name] [[id number]]   fluconazole  (DIFLUCAN ) 150 MG tablet    Sig: Take 1 tablet (150 mg total) by mouth every 3 (three) days as needed.    Dispense:  2 tablet    Refill:  0    Supervising Provider:   LAMPTEY, Cynthia Cynthia [name] [[id number]]     *If you need refills on other medications prior to your next appointment, please contact your pharmacy*  Follow-Up: Call back or seek an in-person evaluation if the symptoms worsen or if the condition fails to improve as anticipated.  [facility] [phone]  Other Instructions  Vaginal Infection (Bacterial Vaginosis): What to Know  Bacterial vaginosis is an infection of the vagina. It happens when the balance of normal germs (bacteria) in the vagina changes. It's common among females ages 70 to 49.  If left untreated, it can increase your risk of getting a sexually transmitted infection (STI). If you're pregnant, you need to get treated right away. This infection can cause a baby to be born early or at a low birth weight. What are the causes? This happens when too many harmful germs grow in the vagina. The exact reason why this happens isn't known. You can't get this infection from toilet Cynthia [name], bedding, swimming pools, or contact with objects around you. What increases the risk? Having new or multiple sexual partners, or unprotected sex. Douching. Using an intrauterine device  (IUD). Smoking. Alcohol and drug abuse. Taking certain antibiotics. Being pregnant. You can get a vaginal infection without being sexually active. However, it most often occurs in sexually active females. What are the signs or symptoms? Some females have no symptoms. If you have symptoms, they may include: Cynthia [name] or white vaginal discharge. It can be watery or foamy. A fish-like smell, especially after sex or during your menstrual period. Itching in and around the vagina. Burning or pain with peeing. How is this diagnosed? This infection is diagnosed based on: Your medical history. A physical exam of the vagina. Checking a sample of vaginal fluid for harmful bacteria or uncommon cells. How is this treated? This condition is treated with antibiotics. These may be given as: A pill. A cream for your vagina. A medicine that you put into your vagina called a suppository. If the infection comes back, you may need more antibiotics. Follow these instructions at home: Medicines Take your medicines only as told. Take or apply your antibiotics as told. Do not stop using them even if you start to feel better. General instructions If you have a female sexual partner, tell her about the infection. She should see her health care provider. Female partners don't need treatment. Avoid sex until treatment is complete. Drink more fluids as told. Keep the area around your vagina and rectum clean. Wash the area daily with warm water. Wipe yourself from front to back after pooping. If you're breastfeeding, talk to your provider about continuing during treatment. How is this prevented? Self-care Do not douche or use vaginal deodorant sprays. Douching can upset the balance of good and harmful bacteria in the vagina, which can cause an infection to happen again. Wear cotton or cotton-lined underwear. Avoid wearing tight pants or pantyhose, especially in the summer. Safe sex Use condoms correctly and  every time you have sex. Use dental dams to protect yourself during oral sex. Limit the number of sexual partners. Get tested for STIs. Your sexual partner should also get tested. Drugs and alcohol Do not smoke, vape, or use nicotine or tobacco. Do not use drugs. Limit the amount of alcohol you drink because it can lead to risky sexual behavior. Where to find more information To learn more: Go to TonerPromos.no. Click Health Topics A-Z. Type bacterial vaginosis in the search box. American Sexual Health Association (ASHA): ashasexualhealth.org U.S. Department of Health and Health and safety inspector, Office on Women's Health: TravelLesson.ca Contact a health care provider if: Your symptoms don't get better, even after treatment. You have more discharge or pain when peeing. You have a fever or chills. You have pain in your belly or pelvis. You have pain during sex. You have vaginal bleeding between menstrual periods. This information is not intended to replace advice given to you by your health care provider. Make sure you discuss any questions you have with your health care provider. Document Revised: 12/20/2022 Document Reviewed: 12/20/2022 Elsevier  Patient Education  2024 Elsevier Inc.  Vaginal Probiotics: AZO vaginal probiotic OLLY Happy Hoo-Ha RAW Vaginal Care RenewLife Women's vaginal probiotic RepHresh Pro-B  Vaginal washes: Honey Pot Summer's Eve Vagisil Feminine cleanser  Boric Acid Suppositories  Healthy vaginal hygiene practices    -  Avoid sleeper pajamas. Nightgowns allow air to circulate.  Sleep without underpants whenever possible.   -  Wear cotton underpants during the day. Double-rinse underwear after washing to avoid residual irritants. Do not use fabric softeners for underwear and swimsuits.   - Avoid tights, leotards, leggings, skinny jeans, and other tight-fitting clothing. Skirts and loose-fitting pants allow air to circulate.   - Avoid pantyliners.  Instead use  tampons or cotton pads.   - Use the restroom after intercourse to help prevent UTI's   - Daily warm bathing is helpful:     - Soak in clean water (no soap) for 10 to 15 minutes. Adding vinegar or baking soda to the water has not been specifically studied and may not be better than clean water alone.      - Use soap to wash regions other than the genital area just before getting out of the tub. Limit use of any soap on genital areas. Use fragance-free soaps.     - Rinse the genital area well and gently pat dry.  Don't rub.  Hair dryer to assist with drying can be used only if on cool setting.     - Do not use bubble baths or perfumed soaps.   - Do not use any feminine sprays, douches or powders.  These contain chemicals that will irritate the skin.   - If the genital area is tender or swollen, cool compresses may relieve the discomfort. Unscented wet wipes can be used instead of toilet paper for wiping.    - Emollients, such as Vaseline, may help protect skin and can be applied to the irritated area.   - Always remember to wipe front-to-back after bowel movements. Pat dry after urination.   - Do not sit in wet swimsuits for long periods of time after swimming   If you have been instructed to have an in-person evaluation today at a local Urgent Care facility, please use the link below. It will take you to a list of all of our available [place] [facility], including address, phone number and hours of operation. Please do not delay care.  [facility]  If you or a family member do not have a primary care provider, use the link below to schedule a visit and establish care. When you choose a [place] primary care physician or advanced practice provider, you gain a long-term partner in health. Find a Primary Care Provider  Learn more about [place]'s in-office and virtual care options: [place] [facility] - Get Care Now

## 2024-04-10 NOTE — Progress Notes (Signed)
 Virtual Visit Consent   Cynthia [name], you are scheduled for a virtual visit with a [facility] provider today. Just as with appointments in the office, your consent must be obtained to participate. Your consent will be active for this visit and any virtual visit you may have with one of our providers in the next 365 days. If you have a MyChart account, a copy of this consent can be sent to you electronically.  As this is a virtual visit, video technology does not allow for your provider to perform a traditional examination. This may limit your provider's ability to fully assess your condition. If your provider identifies any concerns that need to be evaluated in person or the need to arrange testing (such as labs, EKG, etc.), we will make arrangements to do so. Although advances in technology are sophisticated, we cannot ensure that it will always work on either your end or our end. If the connection with a video visit is poor, the visit may have to be switched to a telephone visit. With either a video or telephone visit, we are not always able to ensure that we have a secure connection.  By engaging in this virtual visit, you consent to the provision of healthcare and authorize for your insurance to be billed (if applicable) for the services provided during this visit. Depending on your insurance coverage, you may receive a charge related to this service.  I need to obtain your verbal consent now. Are you willing to proceed with your visit today? Cynthia [name] has provided verbal consent on 04/10/2024 for a virtual visit (video or telephone). Delon CHRISTELLA Dickinson, PA-C  Date: 04/10/2024 4:53 PM   Virtual Visit via Video Note   I, Delon CHRISTELLA Dickinson, connected with  Cynthia [name]  ([id number], 2001-02-12) on 04/10/24 at  4:30 PM EDT by a video-enabled telemedicine application and verified that I am speaking with the correct person using two identifiers.  Location: Patient: Virtual Visit Location  Patient: Home Provider: Virtual Visit Location Provider: Home Office   I discussed the limitations of evaluation and management by telemedicine and the availability of in person appointments. The patient expressed understanding and agreed to proceed.    History of Present Illness: Cynthia [name] is a 23 y.o. who identifies as a female who was assigned female at birth, and is being seen today for vaginal issue.  HPI: Vaginal Discharge The patient's primary symptoms include a genital odor and vaginal discharge. This is a recurrent problem. The current episode started more than 1 month ago. The problem occurs constantly. The problem has been unchanged. The patient is experiencing no pain. She is not pregnant. Pertinent negatives include no abdominal pain, dysuria, fever, flank pain, frequency or nausea. The vaginal discharge was malodorous.   May 2024 was diagnosed with BV and Yeast; Treated for both [metronidazole  tablet and fluconazole ]; then developed worsening UTI symptoms through Dec 2024 that progressed to a kidney infection. Feels BV symptoms never fully cleared or are continuously waxing and waning frequently enough it seems consistent.  Problems:  Patient Active Problem List   Diagnosis Date Noted   Contraceptive education 08/20/2017    Allergies: No Known Allergies Medications:  Current Outpatient Medications:    clindamycin  (CLEOCIN ) 300 MG capsule, Take 1 capsule (300 mg total) by mouth 2 (two) times daily for 7 days., Disp: 14 capsule, Rfl: 0   fluconazole  (DIFLUCAN ) 150 MG tablet, Take 1 tablet (150 mg total) by mouth every 3 (three)  days as needed., Disp: 2 tablet, Rfl: 0   buPROPion  ER (WELLBUTRIN  SR) 100 MG 12 hr tablet, Take 1 tablet (100 mg total) by mouth every morning., Disp: 30 tablet, Rfl: 2   COVID-19 At Home Antigen Test (CARESTART COVID-19 HOME TEST) KIT, use as directed (Patient not taking: Reported on 12/31/2023), Disp: 2 kit, Rfl: 0   hydrOXYzine  (ATARAX ) 10 MG  tablet, Take 1-2 tablets (10-20 mg total) by mouth daily as needed for anxiety or insomnia. (Patient not taking: Reported on 12/31/2023), Disp: 60 tablet, Rfl: 2   hydrOXYzine  (ATARAX ) 25 MG tablet, Take 1/2 to 1 tablet by mouth up to twice a day as needed for anxiety/insomnia (Patient not taking: Reported on 12/31/2023), Disp: 60 tablet, Rfl: 2   hydrOXYzine  (ATARAX /VISTARIL ) 25 MG tablet, Take 1 tablet (25 mg total) by mouth every 6 (six) hours as needed for itching. (Patient not taking: Reported on 12/31/2023), Disp: 30 tablet, Rfl: 5   PARAGARD  INTRAUTERINE COPPER  IU, by Intrauterine route., Disp: , Rfl:    sertraline  (ZOLOFT ) 50 MG tablet, Take 1 tablet (50 mg total) by mouth daily., Disp: 90 tablet, Rfl: 3   sertraline  (ZOLOFT ) 50 MG tablet, Take 1 and a half tablets by mouth daily, Disp: 45 tablet, Rfl: 2   sertraline  (ZOLOFT ) 50 MG tablet, Take 1.5 tablets (75 mg total) by mouth daily., Disp: 45 tablet, Rfl: 2  Observations/Objective: Patient is well-developed, well-nourished in no acute distress.  Resting comfortably at home.  Head is normocephalic, atraumatic.  No labored breathing.  Speech is clear and coherent with logical content.  Patient is alert and oriented at baseline.    Assessment and Plan: 1. BV (bacterial vaginosis) (Primary) - clindamycin  (CLEOCIN ) 300 MG capsule; Take 1 capsule (300 mg total) by mouth 2 (two) times daily for 7 days.  Dispense: 14 capsule; Refill: 0  2. Antibiotic-induced yeast infection - fluconazole  (DIFLUCAN ) 150 MG tablet; Take 1 tablet (150 mg total) by mouth every 3 (three) days as needed.  Dispense: 2 tablet; Refill: 0  - Symptoms consistent with BV - Clindamycin  prescribed - Limit bubble baths, scented lotions/soaps/detergents - Limit tight fitting clothing - Diflucan  given as prophylaxis as patient tends to get vaginal yeast infections with antibiotic use - Seek on person evaluation if not improving or if symptoms worsen   Follow Up  Instructions: I discussed the assessment and treatment plan with the patient. The patient was provided an opportunity to ask questions and all were answered. The patient agreed with the plan and demonstrated an understanding of the instructions.  A copy of instructions were sent to the patient via MyChart unless otherwise noted below.    The patient was advised to call back or seek an in-person evaluation if the symptoms worsen or if the condition fails to improve as anticipated.    Delon CHRISTELLA Dickinson, PA-C

## 2024-04-19 ENCOUNTER — Telehealth: Admitting: Nurse Practitioner

## 2024-04-19 DIAGNOSIS — F32A Depression, unspecified: Secondary | ICD-10-CM | POA: Diagnosis not present

## 2024-04-19 DIAGNOSIS — F419 Anxiety disorder, unspecified: Secondary | ICD-10-CM | POA: Diagnosis not present

## 2024-04-19 MED ORDER — SERTRALINE HCL 50 MG PO TABS: ORAL_TABLET | ORAL | 1 refills | Status: AC

## 2024-04-19 NOTE — Patient Instructions (Signed)
  Cynthia [name], thank you for joining Haze LELON Servant, NP for today's virtual visit.  While this provider is not your primary care provider (PCP), if your PCP is located in our provider database this encounter information will be shared with them immediately following your visit.   A [facility] MyChart account gives you access to today's visit and all your visits, tests, and labs performed at [facility]  click here if you don't have a [facility] MyChart account or go to [email or web address]  Consent: (Patient) Cynthia [name] provided verbal consent for this virtual visit at the beginning of the encounter.  Current Medications:  Current Outpatient Medications:    buPROPion  ER (WELLBUTRIN  SR) 100 MG 12 hr tablet, Take 1 tablet (100 mg total) by mouth every morning., Disp: 30 tablet, Rfl: 2   COVID-19 At Home Antigen Test (CARESTART COVID-19 HOME TEST) KIT, use as directed (Patient not taking: Reported on 12/31/2023), Disp: 2 kit, Rfl: 0   fluconazole  (DIFLUCAN ) 150 MG tablet, Take 1 tablet (150 mg total) by mouth every 3 (three) days as needed., Disp: 2 tablet, Rfl: 0   hydrOXYzine  (ATARAX ) 10 MG tablet, Take 1-2 tablets (10-20 mg total) by mouth daily as needed for anxiety or insomnia. (Patient not taking: Reported on 12/31/2023), Disp: 60 tablet, Rfl: 2   hydrOXYzine  (ATARAX ) 25 MG tablet, Take 1/2 to 1 tablet by mouth up to twice a day as needed for anxiety/insomnia (Patient not taking: Reported on 12/31/2023), Disp: 60 tablet, Rfl: 2   hydrOXYzine  (ATARAX /VISTARIL ) 25 MG tablet, Take 1 tablet (25 mg total) by mouth every 6 (six) hours as needed for itching. (Patient not taking: Reported on 12/31/2023), Disp: 30 tablet, Rfl: 5   PARAGARD  INTRAUTERINE COPPER  IU, by Intrauterine route., Disp: , Rfl:    sertraline  (ZOLOFT ) 50 MG tablet, Take 1 and a half tablets by mouth daily, Disp: 45 tablet, Rfl: 1   Medications ordered in this encounter:  Meds ordered this encounter   Medications   sertraline  (ZOLOFT ) 50 MG tablet    Sig: Take 1 and a half tablets by mouth daily    Dispense:  45 tablet    Refill:  1    Supervising Provider:   LAMPTEY, PHILIP O [phone]     *If you need refills on other medications prior to your next appointment, please contact your pharmacy*  Follow-Up: Call back or seek an in-person evaluation if the symptoms worsen or if the condition fails to improve as anticipated.  [facility] [phone]  Other Instructions Recommend to get established with PCP in [place] for future refills   If you have been instructed to have an in-person evaluation today at a local Urgent Care facility, please use the link below. It will take you to a list of all of our available [place] [facility], including address, phone number and hours of operation. Please do not delay care.  [facility]  If you or a family member do not have a primary care provider, use the link below to schedule a visit and establish care. When you choose a [facility] primary care physician or advanced practice provider, you gain a long-term partner in health. Find a Primary Care Provider  Learn more about [facility]'s in-office and virtual care options: [place] [facility] - Get Care Now

## 2024-04-19 NOTE — Progress Notes (Signed)
 Virtual Visit Consent   Cynthia [name], you are scheduled for a virtual visit with a [facility] provider today. Just as with appointments in the office, your consent must be obtained to participate. Your consent will be active for this visit and any virtual visit you may have with one of our providers in the next 365 days. If you have a MyChart account, a copy of this consent can be sent to you electronically.  As this is a virtual visit, video technology does not allow for your provider to perform a traditional examination. This may limit your provider's ability to fully assess your condition. If your provider identifies any concerns that need to be evaluated in person or the need to arrange testing (such as labs, EKG, etc.), we will make arrangements to do so. Although advances in technology are sophisticated, we cannot ensure that it will always work on either your end or our end. If the connection with a video visit is poor, the visit may have to be switched to a telephone visit. With either a video or telephone visit, we are not always able to ensure that we have a secure connection.  By engaging in this virtual visit, you consent to the provision of healthcare and authorize for your insurance to be billed (if applicable) for the services provided during this visit. Depending on your insurance coverage, you may receive a charge related to this service.  I need to obtain your verbal consent now. Are you willing to proceed with your visit today? Cynthia [name] has provided verbal consent on 04/19/2024 for a virtual visit (video or telephone). Cynthia LELON Servant, NP  Date: 04/19/2024 5:28 PM   Virtual Visit via Video Note   I, Cynthia [name], connected with  Cynthia [name]  ([id number], 2001/06/21) on 04/19/24 at  5:00 PM EDT by a video-enabled telemedicine application and verified that I am speaking with the correct person using two identifiers.  Location: Patient: Virtual Visit Location Patient:  Home Provider: Virtual Visit Location Provider: Home Office   I discussed the limitations of evaluation and management by telemedicine and the availability of in person appointments. The patient expressed understanding and agreed to proceed.    History of Present Illness: Cynthia [name] is a 23 y.o. who identifies as a female who was assigned female at birth, and is being seen today for anxiety and depression.  [name] takes sertraline  75 mg daily.  She is currently out of her prescription and is requesting a refill today.  At this time she does not have a primary care and has been receiving her refills from [facility].  She no longer lives in [place] and is currently residing in [place].   Problems:  Patient Active Problem List   Diagnosis Date Noted   Contraceptive education 08/20/2017    Allergies: No Known Allergies Medications:  Current Outpatient Medications:    buPROPion  ER (WELLBUTRIN  SR) 100 MG 12 hr tablet, Take 1 tablet (100 mg total) by mouth every morning., Disp: 30 tablet, Rfl: 2   COVID-19 At Home Antigen Test (CARESTART COVID-19 HOME TEST) KIT, use as directed (Patient not taking: Reported on 12/31/2023), Disp: 2 kit, Rfl: 0   fluconazole  (DIFLUCAN ) 150 MG tablet, Take 1 tablet (150 mg total) by mouth every 3 (three) days as needed., Disp: 2 tablet, Rfl: 0   hydrOXYzine  (ATARAX ) 10 MG tablet, Take 1-2 tablets (10-20 mg total) by mouth daily as needed for anxiety or insomnia. (Patient  not taking: Reported on 12/31/2023), Disp: 60 tablet, Rfl: 2   hydrOXYzine  (ATARAX ) 25 MG tablet, Take 1/2 to 1 tablet by mouth up to twice a day as needed for anxiety/insomnia (Patient not taking: Reported on 12/31/2023), Disp: 60 tablet, Rfl: 2   hydrOXYzine  (ATARAX /VISTARIL ) 25 MG tablet, Take 1 tablet (25 mg total) by mouth every 6 (six) hours as needed for itching. (Patient not taking: Reported on 12/31/2023), Disp: 30 tablet, Rfl: 5   PARAGARD  INTRAUTERINE COPPER  IU, by  Intrauterine route., Disp: , Rfl:    sertraline  (ZOLOFT ) 50 MG tablet, Take 1 and a half tablets by mouth daily, Disp: 45 tablet, Rfl: 1  Observations/Objective: Patient is well-developed, well-nourished in no acute distress.  Resting comfortably at home.  Head is normocephalic, atraumatic.  No labored breathing.  Speech is clear and coherent with logical content.  Patient is alert and oriented at baseline.    Assessment and Plan: 1. Anxiety and depression (Primary) - sertraline  (ZOLOFT ) 50 MG tablet; Take 1 and a half tablets by mouth daily  Dispense: 45 tablet; Refill: 1 Recommend to get established with PCP in [place] for future refills  Follow Up Instructions: I discussed the assessment and treatment plan with the patient. The patient was provided an opportunity to ask questions and all were answered. The patient agreed with the plan and demonstrated an understanding of the instructions.  A copy of instructions were sent to the patient via MyChart unless otherwise noted below.     The patient was advised to call back or seek an in-person evaluation if the symptoms worsen or if the condition fails to improve as anticipated.    Burnett Spray W Kristi Hyer, NP

## 2024-05-03 ENCOUNTER — Other Ambulatory Visit: Payer: Self-pay

## 2024-05-03 ENCOUNTER — Telehealth: Admitting: Nurse Practitioner

## 2024-05-03 DIAGNOSIS — J40 Bronchitis, not specified as acute or chronic: Secondary | ICD-10-CM | POA: Diagnosis not present

## 2024-05-03 MED ORDER — AZITHROMYCIN 250 MG PO TABS
ORAL_TABLET | ORAL | 0 refills | Status: AC
  Filled 2024-05-03: qty 6, 5d supply, fill #0

## 2024-05-03 MED ORDER — PROMETHAZINE-DM 6.25-15 MG/5ML PO SYRP
5.0000 mL | ORAL_SOLUTION | Freq: Four times a day (QID) | ORAL | 0 refills | Status: AC | PRN
  Filled 2024-05-03: qty 240, 12d supply, fill #0

## 2024-05-03 MED ORDER — TRIAMCINOLONE ACETONIDE 0.025 % EX OINT
1.0000 | TOPICAL_OINTMENT | Freq: Two times a day (BID) | CUTANEOUS | 0 refills | Status: AC
  Filled 2024-05-03: qty 15, 8d supply, fill #0

## 2024-05-03 NOTE — Patient Instructions (Signed)
 Cynthia [name], thank you for joining Haze LELON Servant, NP for today's virtual visit.  While this provider is not your primary care provider (PCP), if your PCP is located in our provider database this encounter information will be shared with them immediately following your visit.   A [facility] MyChart account gives you access to today's visit and all your visits, tests, and labs performed at [facility]  click here if you don't have a  MyChart account or go to [email or web address]  Consent: (Patient) Cynthia [name] provided verbal consent for this virtual visit at the beginning of the encounter.  Current Medications:  Current Outpatient Medications:    azithromycin  (ZITHROMAX ) 250 MG tablet, Take 2 tablets on day 1, then 1 tablet daily on days 2 through 5, Disp: 6 tablet, Rfl: 0   promethazine-dextromethorphan (PROMETHAZINE-DM) 6.25-15 MG/5ML syrup, Take 5 mLs by mouth 4 (four) times daily as needed., Disp: 240 mL, Rfl: 0   triamcinolone (KENALOG) 0.025 % ointment, Apply 1 Application topically 2 (two) times daily., Disp: 30 g, Rfl: 0   buPROPion  ER (WELLBUTRIN  SR) 100 MG 12 hr tablet, Take 1 tablet (100 mg total) by mouth every morning., Disp: 30 tablet, Rfl: 2   COVID-19 At Home Antigen Test (CARESTART COVID-19 HOME TEST) KIT, use as directed (Patient not taking: Reported on 12/31/2023), Disp: 2 kit, Rfl: 0   fluconazole  (DIFLUCAN ) 150 MG tablet, Take 1 tablet (150 mg total) by mouth every 3 (three) days as needed., Disp: 2 tablet, Rfl: 0   hydrOXYzine  (ATARAX ) 10 MG tablet, Take 1-2 tablets (10-20 mg total) by mouth daily as needed for anxiety or insomnia. (Patient not taking: Reported on 12/31/2023), Disp: 60 tablet, Rfl: 2   hydrOXYzine  (ATARAX ) 25 MG tablet, Take 1/2 to 1 tablet by mouth up to twice a day as needed for anxiety/insomnia (Patient not taking: Reported on 12/31/2023), Disp: 60 tablet, Rfl: 2   hydrOXYzine  (ATARAX /VISTARIL ) 25 MG tablet, Take 1 tablet  (25 mg total) by mouth every 6 (six) hours as needed for itching. (Patient not taking: Reported on 12/31/2023), Disp: 30 tablet, Rfl: 5   PARAGARD  INTRAUTERINE COPPER  IU, by Intrauterine route., Disp: , Rfl:    sertraline  (ZOLOFT ) 50 MG tablet, Take 1 and a half tablets by mouth daily, Disp: 45 tablet, Rfl: 1   Medications ordered in this encounter:  Meds ordered this encounter  Medications   azithromycin  (ZITHROMAX ) 250 MG tablet    Sig: Take 2 tablets on day 1, then 1 tablet daily on days 2 through 5    Dispense:  6 tablet    Refill:  0    Supervising Provider:   LAMPTEY, PHILIP O [[id number]]   triamcinolone (KENALOG) 0.025 % ointment    Sig: Apply 1 Application topically 2 (two) times daily.    Dispense:  30 g    Refill:  0    Supervising Provider:   BLAISE ALEENE KIDD [[id number]]   promethazine-dextromethorphan (PROMETHAZINE-DM) 6.25-15 MG/5ML syrup    Sig: Take 5 mLs by mouth 4 (four) times daily as needed.    Dispense:  240 mL    Refill:  0    Supervising Provider:   BLAISE ALEENE KIDD [[id number]]     *If you need refills on other medications prior to your next appointment, please contact your pharmacy*  Follow-Up: Call back or seek an in-person evaluation if the symptoms worsen or if the condition fails to improve as anticipated.  [facility] [phone]  Other Instructions   If you have been instructed to have an in-person evaluation today at a local Urgent Care facility, please use the link below. It will take you to a list of all of our available [facility], including address, phone number and hours of operation. Please do not delay care.  [facility]  If you or a family member do not have a primary care provider, use the link below to schedule a visit and establish care. When you choose a [facility] primary care physician or advanced practice provider, you gain a long-term partner in health. Find a Primary Care Provider  Learn more  about [facility]'s in-office and virtual care options: [facility] Get Care Now

## 2024-05-03 NOTE — Progress Notes (Signed)
 Virtual Visit Consent   Cynthia [name], you are scheduled for a virtual visit with a [facility] provider today. Just as with appointments in the office, your consent must be obtained to participate. Your consent will be active for this visit and any virtual visit you may have with one of our providers in the next 365 days. If you have a MyChart account, a copy of this consent can be sent to you electronically.  As this is a virtual visit, video technology does not allow for your provider to perform a traditional examination. This may limit your provider's ability to fully assess your condition. If your provider identifies any concerns that need to be evaluated in person or the need to arrange testing (such as labs, EKG, etc.), we will make arrangements to do so. Although advances in technology are sophisticated, we cannot ensure that it will always work on either your end or our end. If the connection with a video visit is poor, the visit may have to be switched to a telephone visit. With either a video or telephone visit, we are not always able to ensure that we have a secure connection.  By engaging in this virtual visit, you consent to the provision of healthcare and authorize for your insurance to be billed (if applicable) for the services provided during this visit. Depending on your insurance coverage, you may receive a charge related to this service.  I need to obtain your verbal consent now. Are you willing to proceed with your visit today? JAQUELIN MEANEY has provided verbal consent on 05/03/2024 for a virtual visit (video or telephone). Haze LELON Servant, NP  Date: 05/03/2024 11:46 AM   Virtual Visit via Video Note   I, Haze LELON Servant, connected with  Cynthia [name]  ([id number], [birth year]) on 05/03/24 at 11:30 AM EDT by a video-enabled telemedicine application and verified that I am speaking with the correct person using two identifiers.  Location: Patient: Virtual Visit Location  Patient: Home Provider: Virtual Visit Location Provider: Home Office   I discussed the limitations of evaluation and management by telemedicine and the availability of in person appointments. The patient expressed understanding and agreed to proceed.    History of Present Illness: Cynthia [name] is a 23 y.o. who identifies as a female who was assigned female at birth, and is being seen today for bronchitis.  Ms Tolleson has been experiencing cough for 3 weeks with chest congestion, runny nose and wheezing. She has tried dayquil and other oTC cough suppressants with no relief.    Problems:  Patient Active Problem List   Diagnosis Date Noted   Contraceptive education 08/20/2017    Allergies: No Known Allergies Medications:  Current Outpatient Medications:    azithromycin  (ZITHROMAX ) 250 MG tablet, Take 2 tablets on day 1, then 1 tablet daily on days 2 through 5, Disp: 6 tablet, Rfl: 0   promethazine-dextromethorphan (PROMETHAZINE-DM) 6.25-15 MG/5ML syrup, Take 5 mLs by mouth 4 (four) times daily as needed., Disp: 240 mL, Rfl: 0   triamcinolone (KENALOG) 0.025 % ointment, Apply 1 Application topically 2 (two) times daily., Disp: 30 g, Rfl: 0   buPROPion  ER (WELLBUTRIN  SR) 100 MG 12 hr tablet, Take 1 tablet (100 mg total) by mouth every morning., Disp: 30 tablet, Rfl: 2   COVID-19 At Home Antigen Test (CARESTART COVID-19 HOME TEST) KIT, use as directed (Patient not taking: Reported on 12/31/2023), Disp: 2 kit, Rfl: 0   fluconazole  (DIFLUCAN ) 150 MG tablet, Take  1 tablet (150 mg total) by mouth every 3 (three) days as needed., Disp: 2 tablet, Rfl: 0   hydrOXYzine  (ATARAX ) 10 MG tablet, Take 1-2 tablets (10-20 mg total) by mouth daily as needed for anxiety or insomnia. (Patient not taking: Reported on 12/31/2023), Disp: 60 tablet, Rfl: 2   hydrOXYzine  (ATARAX ) 25 MG tablet, Take 1/2 to 1 tablet by mouth up to twice a day as needed for anxiety/insomnia (Patient not taking: Reported on 12/31/2023),  Disp: 60 tablet, Rfl: 2   hydrOXYzine  (ATARAX /VISTARIL ) 25 MG tablet, Take 1 tablet (25 mg total) by mouth every 6 (six) hours as needed for itching. (Patient not taking: Reported on 12/31/2023), Disp: 30 tablet, Rfl: 5   PARAGARD  INTRAUTERINE COPPER  IU, by Intrauterine route., Disp: , Rfl:    sertraline  (ZOLOFT ) 50 MG tablet, Take 1 and a half tablets by mouth daily, Disp: 45 tablet, Rfl: 1  Observations/Objective: Patient is well-developed, well-nourished in no acute distress.  Resting comfortably at home.  Head is normocephalic, atraumatic.  No labored breathing.  Speech is clear and coherent with logical content.  Patient is alert and oriented at baseline.    Assessment and Plan: 1. Bronchitis (Primary) - azithromycin  (ZITHROMAX ) 250 MG tablet; Take 2 tablets on day 1, then 1 tablet daily on days 2 through 5  Dispense: 6 tablet; Refill: 0 - triamcinolone (KENALOG) 0.025 % ointment; Apply 1 Application topically 2 (two) times daily.  Dispense: 30 g; Refill: 0 - promethazine-dextromethorphan (PROMETHAZINE-DM) 6.25-15 MG/5ML syrup; Take 5 mLs by mouth 4 (four) times daily as needed.  Dispense: 240 mL; Refill: 0    Follow Up Instructions: I discussed the assessment and treatment plan with the patient. The patient was provided an opportunity to ask questions and all were answered. The patient agreed with the plan and demonstrated an understanding of the instructions.  A copy of instructions were sent to the patient via MyChart unless otherwise noted below.     The patient was advised to call back or seek an in-person evaluation if the symptoms worsen or if the condition fails to improve as anticipated.    Cynthia [name] W Delynn Olvera, NP

## 2024-05-05 ENCOUNTER — Encounter: Payer: Self-pay | Admitting: Pharmacist

## 2024-05-05 ENCOUNTER — Other Ambulatory Visit: Payer: Self-pay
